# Patient Record
Sex: Female | Born: 1965 | Race: White | Hispanic: No | Marital: Married | Smoking: Former smoker
Health system: Southern US, Community
[De-identification: ages and names within clinical notes are randomized; demographics above are authoritative.]

## PROBLEM LIST (undated history)

## (undated) DIAGNOSIS — E669 Obesity, unspecified: Secondary | ICD-10-CM

## (undated) DIAGNOSIS — M1990 Unspecified osteoarthritis, unspecified site: Secondary | ICD-10-CM

## (undated) DIAGNOSIS — Z8701 Personal history of pneumonia (recurrent): Secondary | ICD-10-CM

## (undated) DIAGNOSIS — Z8489 Family history of other specified conditions: Secondary | ICD-10-CM

## (undated) DIAGNOSIS — R011 Cardiac murmur, unspecified: Secondary | ICD-10-CM

## (undated) DIAGNOSIS — R87619 Unspecified abnormal cytological findings in specimens from cervix uteri: Secondary | ICD-10-CM

## (undated) DIAGNOSIS — T148XXA Other injury of unspecified body region, initial encounter: Secondary | ICD-10-CM

## (undated) DIAGNOSIS — E7800 Pure hypercholesterolemia, unspecified: Secondary | ICD-10-CM

## (undated) HISTORY — PX: ESOPHAGOGASTRODUODENOSCOPY: SHX1529

## (undated) HISTORY — DX: Unspecified abnormal cytological findings in specimens from cervix uteri: R87.619

## (undated) HISTORY — DX: Obesity, unspecified: E66.9

## (undated) HISTORY — DX: Personal history of pneumonia (recurrent): Z87.01

## (undated) HISTORY — PX: KNEE SURGERY: SHX244

## (undated) HISTORY — DX: Pure hypercholesterolemia, unspecified: E78.00

## (undated) HISTORY — PX: TUBAL LIGATION: SHX77

---
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
## 2005-03-02 ENCOUNTER — Ambulatory Visit: Payer: Self-pay | Admitting: Specialist

## 2005-03-29 ENCOUNTER — Ambulatory Visit: Payer: Self-pay | Admitting: Specialist

## 2006-07-17 ENCOUNTER — Ambulatory Visit: Payer: Self-pay | Admitting: Family Medicine

## 2007-05-02 HISTORY — PX: ENDOMETRIAL ABLATION: SHX621

## 2008-11-05 ENCOUNTER — Ambulatory Visit: Payer: Self-pay | Admitting: Family Medicine

## 2010-05-24 ENCOUNTER — Ambulatory Visit: Payer: Self-pay | Admitting: Family Medicine

## 2012-12-27 LAB — HM PAP SMEAR

## 2013-01-09 ENCOUNTER — Ambulatory Visit: Payer: Self-pay | Admitting: Family Medicine

## 2014-11-06 ENCOUNTER — Other Ambulatory Visit: Payer: Self-pay | Admitting: Family Medicine

## 2014-11-06 MED ORDER — CLOBETASOL PROPIONATE 0.05 % EX CREA: 1.0000 "application " | TOPICAL_CREAM | Freq: Two times a day (BID) | CUTANEOUS | Status: DC | PRN

## 2014-11-06 NOTE — Telephone Encounter (Signed)
Patient is having itching, burning and irritation on her labia. She said she had the same issue 2 years ago and was given Clobetasol Propionate 0.05% cream. She wants to know if she can get a refill on it.

## 2014-12-28 DIAGNOSIS — E669 Obesity, unspecified: Secondary | ICD-10-CM | POA: Insufficient documentation

## 2014-12-28 DIAGNOSIS — E7800 Pure hypercholesterolemia, unspecified: Secondary | ICD-10-CM | POA: Insufficient documentation

## 2015-01-05 ENCOUNTER — Encounter: Payer: Self-pay | Admitting: Family Medicine

## 2015-01-05 ENCOUNTER — Ambulatory Visit (INDEPENDENT_AMBULATORY_CARE_PROVIDER_SITE_OTHER): Payer: 59 | Admitting: Family Medicine

## 2015-01-05 VITALS — BP 118/81 | HR 62 | Temp 97.0°F | Ht 62.75 in | Wt 168.0 lb

## 2015-01-05 DIAGNOSIS — Z0000 Encounter for general adult medical examination without abnormal findings: Secondary | ICD-10-CM | POA: Insufficient documentation

## 2015-01-05 DIAGNOSIS — IMO0001 Reserved for inherently not codable concepts without codable children: Secondary | ICD-10-CM

## 2015-01-05 DIAGNOSIS — E663 Overweight: Secondary | ICD-10-CM | POA: Insufficient documentation

## 2015-01-05 DIAGNOSIS — Z1211 Encounter for screening for malignant neoplasm of colon: Secondary | ICD-10-CM | POA: Insufficient documentation

## 2015-01-05 DIAGNOSIS — Z1239 Encounter for other screening for malignant neoplasm of breast: Secondary | ICD-10-CM | POA: Insufficient documentation

## 2015-01-05 DIAGNOSIS — R030 Elevated blood-pressure reading, without diagnosis of hypertension: Secondary | ICD-10-CM

## 2015-01-05 HISTORY — DX: Encounter for general adult medical examination without abnormal findings: Z00.00

## 2015-01-05 HISTORY — DX: Reserved for inherently not codable concepts without codable children: IMO0001

## 2015-01-05 NOTE — Assessment & Plan Note (Addendum)
SBE taught and encouraged; yearly to every other year mammograms; number given to patient for her to call and schedule her own appt; order entered by MD; see AVS

## 2015-01-05 NOTE — Progress Notes (Signed)
BP 118/81 mmHg  Pulse 62  Temp(Src) 97 F (36.1 C)  Ht 5' 2.75" (1.594 m)  Wt 168 lb (76.204 kg)  BMI 29.99 kg/m2  SpO2 100%   Subjective:    Patient ID: Stephanie Pineda, female    DOB: [birth year], 49 y.o.   MRN: [id number]  HPI: Stephanie Pineda is a 49 y.o. female  Chief Complaint  Patient presents with  . Annual Exam    last pap 11/2012   Since last visit, she had an injury in July, fell and took a bounce on her bottom; after a few weeks of pain, went to the doctor and has a little chip off of her bottom and still painful; was told it would take a while to heal; pulling tree and mechanical, not seizure or cardiac etiology No gynecologist; not due for pap; abnormal pap years and years ago, laser surgery, 1985; definitely had more than 3 normal paps since then; no problems No hx of colonoscopy; no family hx of colon cancer; no bleeding or change in bowel habits  Alcohol / tobacco: not an issue Aspirin: not old enough yet Osteoporosis: family hx only Depression screen PHQ 2/9 01/05/2015  Decreased Interest 0  Down, Depressed, Hopeless 0  PHQ - 2 Score 0  High blood pressure today; on Lodine for a month now almost; for the pelvic pain; no added salt; reads food labels for salt; no decongestants; tylenol occasionally No abuse HIV, Hep B, Hep C; declined Skin cancer: wears sunscreen  Relevant past medical, surgical, family and social history reviewed and updated as indicated. Interim medical history since our last visit reviewed. Allergies and medications reviewed and updated.  Review of Systems  Constitutional: Negative for fever, chills and unexpected weight change.  HENT: Negative for hearing loss.   Eyes: Negative for visual disturbance.  Respiratory: Negative for cough and shortness of breath.   Cardiovascular: Negative for chest pain.  Gastrointestinal: Negative for blood in stool.  Endocrine: Positive for heat intolerance.  Genitourinary: Negative for hematuria.   Musculoskeletal:       Pain from fall, see HPI  Skin:       Has a mole on right arm  Allergic/Immunologic: Positive for environmental allergies (a few allergies). Negative for food allergies.  Neurological: Negative for tremors.  Hematological: Bruises/bleeds easily (bruise on the arms since taking lodine just after helping move; no nosebleeds or gum bleeding).  Psychiatric/Behavioral: Negative for dysphoric mood.    Per HPI unless specifically indicated above     Objective:    BP 118/81 mmHg  Pulse 62  Temp(Src) 97 F (36.1 C)  Ht 5' 2.75" (1.594 m)  Wt 168 lb (76.204 kg)  BMI 29.99 kg/m2  SpO2 100%  Wt Readings from Last 3 Encounters:  01/05/15 168 lb (76.204 kg)  12/31/13 165 lb (74.844 kg)    Physical Exam  Constitutional: She appears well-developed and well-nourished.  HENT:  Head: Normocephalic and atraumatic.  Right Ear: Hearing, tympanic membrane, external ear and ear canal normal.  Left Ear: Hearing, tympanic membrane, external ear and ear canal normal.  Eyes: Conjunctivae and EOM are normal. Right eye exhibits no hordeolum. Left eye exhibits no hordeolum. No scleral icterus.  Neck: Carotid bruit is not present. No thyromegaly present.  Cardiovascular: Normal rate, regular rhythm, S1 normal, S2 normal and normal heart sounds.   No extrasystoles are present.  Pulmonary/Chest: Effort normal and breath sounds normal. No respiratory distress. Right breast exhibits no inverted nipple, no mass,  no nipple discharge, no skin change and no tenderness. Left breast exhibits no inverted nipple, no mass, no nipple discharge, no skin change and no tenderness. Breasts are symmetrical.  Abdominal: Soft. Normal appearance and bowel sounds are normal. She exhibits no distension, no abdominal bruit, no pulsatile midline mass and no mass. There is no hepatosplenomegaly. There is no tenderness. No hernia.  Musculoskeletal: Normal range of motion. She exhibits no edema.  Lymphadenopathy:        Head (right side): No submandibular adenopathy present.       Head (left side): No submandibular adenopathy present.    She has no cervical adenopathy.    She has no axillary adenopathy.  Neurological: She is alert. She displays no tremor. No cranial nerve deficit. She exhibits normal muscle tone. Gait normal.  Reflex Scores:      Patellar reflexes are 2+ on the right side and 2+ on the left side. Skin: Skin is warm and dry. Bruising (bruises on volar aspect distal forearms) and lesion (on the right deltoid, slightly papular erythematous, excoriated lesion about 5 mm diameter; positive Fitzpatrick's sign) noted. No ecchymosis noted. No cyanosis. No pallor.  Psychiatric: Her speech is normal and behavior is normal. Thought content normal. Her mood appears not anxious. She does not exhibit a depressed mood.    Results for orders placed or performed in visit on 12/28/14  HM PAP SMEAR  Result Value Ref Range   HM Pap smear per PP       Assessment & Plan:   Problem List Items Addressed This Visit      Other   Colon cancer screening - Primary    Refer to gastroenterologist      Relevant Orders   Ambulatory referral to Gastroenterology   Overweight (BMI 25.0-29.9)    Politely declined nutrition therapy referral; keep up working with trainer and watch portions and stay hydrated, limit empty calories      Elevated blood pressure    Recheck was normal; DASH guidelines; likely from Lodine and pain      Breast cancer screening    SBE taught and encouraged; yearly to every other year mammograms; number given to patient for her to call and schedule her own appt; order entered by MD; see AVS      Relevant Orders   MM Digital Screening   Preventative health care    Healthy living encouraged; see AVS      Relevant Orders   CBC with Differential/Platelet   Comprehensive metabolic panel   TSH   Lipid Panel w/o Chol/HDL Ratio     Follow up plan: Return in about 1 year (around  01/05/2016) for next physical.  An after-visit summary was printed and given to the patient at [facility].  Please see the patient instructions which may contain other information and recommendations beyond what is mentioned above in the assessment and plan.

## 2015-01-05 NOTE — Assessment & Plan Note (Signed)
Healthy living encouraged; see AVS

## 2015-01-05 NOTE — Patient Instructions (Addendum)
Your goal blood pressure is less than 140 mmHg on top. Try to follow the DASH guidelines (DASH stands for Dietary Approaches to Stop Hypertension) Try to limit the sodium in your diet.  Ideally, consume less than 1.5 grams (less than 1,$RemoveBefo'500mg'[id number]$ ) per day. Do not add salt when cooking or at the table.  Check the sodium amount on labels when shopping, and choose items lower in sodium when given a choice. Avoid or limit foods that already contain a lot of sodium. Eat a diet rich in fruits and vegetables and whole grains. 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 Talk with your health care provider about what schedule of regular examinations is right for you. This is a good chance for you to check in with your provider about disease prevention and staying healthy. In between checkups, there are plenty of things you can do on your own. Experts have done a lot of research about which lifestyle changes and preventive measures are most likely to keep you healthy. Ask your health care provider for more information. WEIGHT AND DIET  Eat a healthy diet  Be sure to include plenty of vegetables, fruits, low-fat dairy products, and lean protein.  Do not eat a lot of foods high in solid fats, added sugars, or salt.  Get regular exercise. This is one of the most important things you can do for your health.  Most adults should exercise for at least 150 minutes each week. The exercise should increase your heart rate and make you sweat (moderate-intensity exercise).  Most adults should also do strengthening exercises at least twice a week. This is in addition to the moderate-intensity exercise.  Maintain a healthy weight  Body mass index (BMI) is a measurement that can  be used to identify possible weight problems. It estimates body fat based on height and weight. Your health care provider can help determine your BMI and help you achieve or maintain a healthy weight.  For females 63 years of age and older:   A BMI below 18.5 is considered underweight.  A BMI of 18.5 to 24.9 is normal.  A BMI of 25 to 29.9 is considered overweight.  A BMI of 30 and above is considered obese.  Watch levels of cholesterol and blood lipids  You should start having your blood tested for lipids and cholesterol at 51 years of age, then have this test every 5 years.  You may need to have your cholesterol levels checked more often if:  Your lipid or cholesterol levels are high.  You are older than 51 years of age.  You are at high risk for heart disease.  CANCER SCREENING   Lung Cancer  Lung cancer screening is recommended for adults 25-59 years old who are at high risk for lung cancer because of a history of smoking.  A yearly low-dose CT scan of the lungs is recommended for people who:  Currently smoke.  Have quit within the past 15 years.  Have at least a 30-pack-year history of smoking. A pack year is smoking an average of one pack of cigarettes a day for 1 year.  Yearly screening should continue until it has been 15 years since you quit.  Yearly screening should  stop if you develop a health problem that would prevent you from having lung cancer treatment.  Breast Cancer  Practice breast self-awareness. This means understanding how your breasts normally appear and feel.  It also means doing regular breast self-exams. Let your health care provider know about any changes, no matter how small.  If you are in your 20s or 30s, you should have a clinical breast exam (CBE) by a health care provider every 1-3 years as part of a regular health exam.  If you are 59 or older, have a CBE every year. Also consider having a breast X-ray (mammogram) every year.  If  you have a family history of breast cancer, talk to your health care provider about genetic screening.  If you are at high risk for breast cancer, talk to your health care provider about having an MRI and a mammogram every year.  Breast cancer gene (BRCA) assessment is recommended for women who have family members with BRCA-related cancers. BRCA-related cancers include:  Breast.  Ovarian.  Tubal.  Peritoneal cancers.  Results of the assessment will determine the need for genetic counseling and BRCA1 and BRCA2 testing. Cervical Cancer Routine pelvic examinations to screen for cervical cancer are no longer recommended for nonpregnant women who are considered low risk for cancer of the pelvic organs (ovaries, uterus, and vagina) and who do not have symptoms. A pelvic examination may be necessary if you have symptoms including those associated with pelvic infections. Ask your health care provider if a screening pelvic exam is right for you.   The Pap test is the screening test for cervical cancer for women who are considered at risk.  If you had a hysterectomy for a problem that was not cancer or a condition that could lead to cancer, then you no longer need Pap tests.  If you are older than 65 years, and you have had normal Pap tests for the past 10 years, you no longer need to have Pap tests.  If you have had past treatment for cervical cancer or a condition that could lead to cancer, you need Pap tests and screening for cancer for at least 20 years after your treatment.  If you no longer get a Pap test, assess your risk factors if they change (such as having a new sexual partner). This can affect whether you should start being screened again.  Some women have medical problems that increase their chance of getting cervical cancer. If this is the case for you, your health care provider may recommend more frequent screening and Pap tests.  The human papillomavirus (HPV) test is another test  that may be used for cervical cancer screening. The HPV test looks for the virus that can cause cell changes in the cervix. The cells collected during the Pap test can be tested for HPV.  The HPV test can be used to screen women 31 years of age and older. Getting tested for HPV can extend the interval between normal Pap tests from three to five years.  An HPV test also should be used to screen women of any age who have unclear Pap test results.  After 51 years of age, women should have HPV testing as often as Pap tests.  Colorectal Cancer  This type of cancer can be detected and often prevented.  Routine colorectal cancer screening usually begins at 51 years of age and continues through 51 years of age.  Your health care provider may recommend screening at an earlier age  if you have risk factors for colon cancer.  Your health care provider may also recommend using home test kits to check for hidden blood in the stool.  A small camera at the end of a tube can be used to examine your colon directly (sigmoidoscopy or colonoscopy). This is done to check for the earliest forms of colorectal cancer.  Routine screening usually begins at age 10.  Direct examination of the colon should be repeated every 5-10 years through 51 years of age. However, you may need to be screened more often if early forms of precancerous polyps or small growths are found. Skin Cancer  Check your skin from head to toe regularly.  Tell your health care provider about any new moles or changes in moles, especially if there is a change in a mole's shape or color.  Also tell your health care provider if you have a mole that is larger than the size of a pencil eraser.  Always use sunscreen. Apply sunscreen liberally and repeatedly throughout the day.  Protect yourself by wearing long sleeves, pants, a wide-brimmed hat, and sunglasses whenever you are outside. HEART DISEASE, DIABETES, AND HIGH BLOOD PRESSURE   Have  your blood pressure checked at least every 1-2 years. High blood pressure causes heart disease and increases the risk of stroke.  If you are between 53 years and 51 years old, ask your health care provider if you should take aspirin to prevent strokes.  Have regular diabetes screenings. This involves taking a blood sample to check your fasting blood sugar level.  If you are at a normal weight and have a low risk for diabetes, have this test once every three years after 51 years of age.  If you are overweight and have a high risk for diabetes, consider being tested at a younger age or more often. PREVENTING INFECTION  Hepatitis B  If you have a higher risk for hepatitis B, you should be screened for this virus. You are considered at high risk for hepatitis B if:  You were born in a country where hepatitis B is common. Ask your health care provider which countries are considered high risk.  Your parents were born in a high-risk country, and you have not been immunized against hepatitis B (hepatitis B vaccine).  You have HIV or AIDS.  You use needles to inject street drugs.  You live with someone who has hepatitis B.  You have had sex with someone who has hepatitis B.  You get hemodialysis treatment.  You take certain medicines for conditions, including cancer, organ transplantation, and autoimmune conditions. Hepatitis C  Blood testing is recommended for:  Everyone born from [birth year] through 1967.  Anyone with known risk factors for hepatitis C. Sexually transmitted infections (STIs)  You should be screened for sexually transmitted infections (STIs) including gonorrhea and chlamydia if:  You are sexually active and are younger than 51 years of age.  You are older than 51 years of age and your health care provider tells you that you are at risk for this type of infection.  Your sexual activity has changed since you were last screened and you are at an increased risk for chlamydia  or gonorrhea. Ask your health care provider if you are at risk.  If you do not have HIV, but are at risk, it may be recommended that you take a prescription medicine daily to prevent HIV infection. This is called pre-exposure prophylaxis (PrEP). You are considered at risk if:  You are sexually active and do not regularly use condoms or know the HIV status of your partner(s).  You take drugs by injection.  You are sexually active with a partner who has HIV. Talk with your health care provider about whether you are at high risk of being infected with HIV. If you choose to begin PrEP, you should first be tested for HIV. You should then be tested every 3 months for as long as you are taking PrEP.  PREGNANCY   If you are premenopausal and you may become pregnant, ask your health care provider about preconception counseling.  If you may become pregnant, take 400 to 800 micrograms (mcg) of folic acid every day.  If you want to prevent pregnancy, talk to your health care provider about birth control (contraception). OSTEOPOROSIS AND MENOPAUSE   Osteoporosis is a disease in which the bones lose minerals and strength with aging. This can result in serious bone fractures. Your risk for osteoporosis can be identified using a bone density scan.  If you are 53 years of age or older, or if you are at risk for osteoporosis and fractures, ask your health care provider if you should be screened.  Ask your health care provider whether you should take a calcium or vitamin D supplement to lower your risk for osteoporosis.  Menopause may have certain physical symptoms and risks.  Hormone replacement therapy may reduce some of these symptoms and risks. Talk to your health care provider about whether hormone replacement therapy is right for you.  HOME CARE INSTRUCTIONS   Schedule regular health, dental, and eye exams.  Stay current with your immunizations.   Do not use any tobacco products including  cigarettes, chewing tobacco, or electronic cigarettes.  If you are pregnant, do not drink alcohol.  If you are breastfeeding, limit how much and how often you drink alcohol.  Limit alcohol intake to no more than 1 drink per day for nonpregnant women. One drink equals 12 ounces of beer, 5 ounces of wine, or 1 ounces of hard liquor.  Do not use street drugs.  Do not share needles.  Ask your health care provider for help if you need support or information about quitting drugs.  Tell your health care provider if you often feel depressed.  Tell your health care provider if you have ever been abused or do not feel safe at home. Document Released: 10/31/2010 Document Revised: 09/01/2013 Document Reviewed: 03/19/2013 [place] Patient Information 2015 [place], [place]. This information is not intended to replace advice given to you by your health care provider. Make sure you discuss any questions you have with your health care provider. DASH Eating Plan DASH stands for "Dietary Approaches to Stop Hypertension." The DASH eating plan is a healthy eating plan that has been shown to reduce high blood pressure (hypertension). Additional health benefits may include reducing the risk of type 2 diabetes mellitus, heart disease, and stroke. The DASH eating plan may also help with weight loss. WHAT DO I NEED TO KNOW ABOUT THE DASH EATING PLAN? For the DASH eating plan, you will follow these general guidelines:  Choose foods with a percent daily value for sodium of less than 5% (as listed on the food label).  Use salt-free seasonings or herbs instead of table salt or sea salt.  Check with your health care provider or pharmacist before using salt substitutes.  Eat lower-sodium products, often labeled as "lower sodium" or "no salt added."  Eat fresh foods.  Eat more  vegetables, fruits, and low-fat dairy products.  Choose whole grains. Look for the word "whole" as the first word in the ingredient  list.  Choose fish and skinless chicken or [place] more often than red meat. Limit fish, poultry, and meat to 6 oz (170 g) each day.  Limit sweets, desserts, sugars, and sugary drinks.  Choose heart-healthy fats.  Limit cheese to 1 oz (28 g) per day.  Eat more home-cooked food and less restaurant, buffet, and fast food.  Limit fried foods.  Cook foods using methods other than frying.  Limit canned vegetables. If you do use them, rinse them well to decrease the sodium.  When eating at a restaurant, ask that your food be prepared with less salt, or no salt if possible. WHAT FOODS CAN I EAT? Seek help from a dietitian for individual calorie needs. Grains Whole grain or whole wheat bread. Brown Peri. Whole grain or whole wheat pasta. Quinoa, bulgur, and whole grain cereals. Low-sodium cereals. Corn or whole wheat flour tortillas. Whole grain cornbread. Whole grain crackers. Low-sodium crackers. Vegetables Fresh or frozen vegetables (raw, steamed, roasted, or grilled). Low-sodium or reduced-sodium tomato and vegetable juices. Low-sodium or reduced-sodium tomato sauce and paste. Low-sodium or reduced-sodium canned vegetables.  Fruits All fresh, canned (in natural juice), or frozen fruits. Meat and Other Protein Products Ground beef (85% or leaner), grass-fed beef, or beef trimmed of fat. Skinless chicken or [place]. Ground chicken or [place]. Pork trimmed of fat. All fish and seafood. Eggs. Dried beans, peas, or lentils. Unsalted nuts and seeds. Unsalted canned beans. Dairy Low-fat dairy products, such as skim or 1% milk, 2% or reduced-fat cheeses, low-fat ricotta or cottage cheese, or plain low-fat yogurt. Low-sodium or reduced-sodium cheeses. Fats and Oils Tub margarines without trans fats. Light or reduced-fat mayonnaise and salad dressings (reduced sodium). Avocado. Safflower, olive, or canola oils. Natural peanut or almond butter. Other Unsalted popcorn and pretzels. The items listed  above may not be a complete list of recommended foods or beverages. Contact your dietitian for more options. WHAT FOODS ARE NOT RECOMMENDED? Grains White bread. White pasta. White Troupe. Refined cornbread. Bagels and croissants. Crackers that contain trans fat. Vegetables Creamed or fried vegetables. Vegetables in a cheese sauce. Regular canned vegetables. Regular canned tomato sauce and paste. Regular tomato and vegetable juices. Fruits Dried fruits. Canned fruit in light or heavy syrup. Fruit juice. Meat and Other Protein Products Fatty cuts of meat. Ribs, chicken wings, bacon, sausage, bologna, salami, chitterlings, fatback, hot dogs, bratwurst, and packaged luncheon meats. Salted nuts and seeds. Canned beans with salt. Dairy Whole or 2% milk, cream, half-and-half, and cream cheese. Whole-fat or sweetened yogurt. Full-fat cheeses or blue cheese. Nondairy creamers and whipped toppings. Processed cheese, cheese spreads, or cheese curds. Condiments Onion and garlic salt, seasoned salt, table salt, and sea salt. Canned and packaged gravies. Worcestershire sauce. Tartar sauce. Barbecue sauce. Teriyaki sauce. Soy sauce, including reduced sodium. Steak sauce. Fish sauce. Oyster sauce. Cocktail sauce. Horseradish. Ketchup and mustard. Meat flavorings and tenderizers. Bouillon cubes. Hot sauce. Tabasco sauce. Marinades. Taco seasonings. Relishes. Fats and Oils Butter, stick margarine, lard, shortening, ghee, and bacon fat. Coconut, palm kernel, or palm oils. Regular salad dressings. Other Pickles and olives. Salted popcorn and pretzels. The items listed above may not be a complete list of foods and beverages to avoid. Contact your dietitian for more information. WHERE CAN I FIND MORE INFORMATION? National Heart, Lung, and Blood Institute: travelstabloid.com Document Released: 04/06/2011 Document Revised: 09/01/2013 Document Reviewed: 02/19/2013  [employer] Patient  Information 2015 [place]. This information is not intended to replace advice given to you by your health care provider. Make sure you discuss any questions you have with your health care provider.

## 2015-01-05 NOTE — Assessment & Plan Note (Signed)
Recheck was normal; DASH guidelines; likely from [place] and pain

## 2015-01-05 NOTE — Assessment & Plan Note (Signed)
Politely declined nutrition therapy referral; keep up working with trainer and watch portions and stay hydrated, limit empty calories

## 2015-01-05 NOTE — Assessment & Plan Note (Signed)
Refer to gastroenterologist 

## 2015-01-06 ENCOUNTER — Encounter: Payer: Self-pay | Admitting: Family Medicine

## 2015-01-06 LAB — COMPREHENSIVE METABOLIC PANEL
ALBUMIN: 4.2 g/dL (ref 3.5–5.5)
ALK PHOS: 55 IU/L (ref 39–117)
ALT: 17 IU/L (ref 0–32)
AST: 18 IU/L (ref 0–40)
Albumin/Globulin Ratio: 1.6 (ref 1.1–2.5)
BILIRUBIN TOTAL: 0.4 mg/dL (ref 0.0–1.2)
BUN / CREAT RATIO: 19 (ref 9–23)
BUN: 13 mg/dL (ref 6–24)
CHLORIDE: 103 mmol/L (ref 97–108)
CO2: 21 mmol/L (ref 18–29)
Calcium: 9.6 mg/dL (ref 8.7–10.2)
Creatinine, Ser: 0.67 mg/dL (ref 0.57–1.00)
GFR calc Af Amer: 119 mL/min/{1.73_m2} (ref >59)
GFR calc non Af Amer: 103 mL/min/{1.73_m2} (ref >59)
Globulin, Total: 2.7 g/dL (ref 1.5–4.5)
Glucose: 94 mg/dL (ref 65–99)
Potassium: 3.9 mmol/L (ref 3.5–5.2)
Sodium: 141 mmol/L (ref 134–144)
Total Protein: 6.9 g/dL (ref 6.0–8.5)

## 2015-01-06 LAB — CBC WITH DIFFERENTIAL/PLATELET
BASOS ABS: 0 10*3/uL (ref 0.0–0.2)
Basos: 0 %
EOS (ABSOLUTE): 0.1 10*3/uL (ref 0.0–0.4)
Eos: 2 %
HEMOGLOBIN: 12.6 g/dL (ref 11.1–15.9)
Hematocrit: 37 % (ref 34.0–46.6)
Immature Grans (Abs): 0 10*3/uL (ref 0.0–0.1)
Immature Granulocytes: 0 %
Lymphocytes Absolute: 1.7 10*3/uL (ref 0.7–3.1)
Lymphs: 33 %
MCH: 29.5 pg (ref 26.6–33.0)
MCHC: 34.1 g/dL (ref 31.5–35.7)
MCV: 87 fL (ref 79–97)
MONOCYTES: 9 %
Monocytes Absolute: 0.4 10*3/uL (ref 0.1–0.9)
NEUTROS ABS: 2.8 10*3/uL (ref 1.4–7.0)
Neutrophils: 56 %
Platelets: 224 10*3/uL (ref 150–379)
RBC: 4.27 x10E6/uL (ref 3.77–5.28)
RDW: 14.1 % (ref 12.3–15.4)
WBC: 5 10*3/uL (ref 3.4–10.8)

## 2015-01-06 LAB — LIPID PANEL W/O CHOL/HDL RATIO
CHOLESTEROL TOTAL: 200 mg/dL — AB (ref 100–199)
HDL: 77 mg/dL (ref >39)
LDL Calculated: 111 mg/dL — ABNORMAL HIGH (ref 0–99)
Triglycerides: 59 mg/dL (ref 0–149)
VLDL Cholesterol Cal: 12 mg/dL (ref 5–40)

## 2015-01-06 LAB — TSH: TSH: 0.726 u[IU]/mL (ref 0.450–4.500)

## 2015-01-12 ENCOUNTER — Other Ambulatory Visit: Payer: Self-pay

## 2015-01-12 ENCOUNTER — Telehealth: Payer: Self-pay | Admitting: Gastroenterology

## 2015-01-12 NOTE — Telephone Encounter (Signed)
Gastroenterology Pre-Procedure Review  Request Date:02-18-2015 Requesting Physician: Dr.Lada  PATIENT REVIEW QUESTIONS: The patient responded to the following health history questions as indicated:    1. Are you having any GI issues? no 2. Do you have a personal history of Polyps? no 3. Do you have a family history of Colon Cancer or Polyps? no 4. Diabetes Mellitus? no 5. Joint replacements in the past 12 months?no 6. Major health problems in the past 3 months?no 7. Any artificial heart valves, MVP, or defibrillator?no    MEDICATIONS & ALLERGIES:    Patient reports the following regarding taking any anticoagulation/antiplatelet therapy:   Plavix, Coumadin, Eliquis, Xarelto, Lovenox, Pradaxa, Brilinta, or Effient? no Aspirin? no  Patient confirms/reports the following medications:  Current Outpatient Prescriptions  Medication Sig Dispense Refill   clobetasol cream (TEMOVATE) 7.37 % Apply 1 application topically 2 (two) times daily as needed. 15 g 0   etodolac (LODINE) 400 MG tablet Take 400 mg by mouth 2 (two) times daily.      No current facility-administered medications for this visit.    Patient confirms/reports the following allergies:  No Known Allergies  No orders of the defined types were placed in this encounter.    AUTHORIZATION INFORMATION Primary Insurance: 1D#: Group #:  Secondary Insurance: 1D#: Group #:  SCHEDULE INFORMATION: Date: 02-18-2015 Time: Location:[facility]

## 2015-01-20 ENCOUNTER — Ambulatory Visit
Admission: RE | Admit: 2015-01-20 | Discharge: 2015-01-20 | Disposition: A | Discharge status: Home / Self Care | Payer: 59 | Source: Ambulatory Visit | Attending: Family Medicine | Admitting: Family Medicine

## 2015-01-20 DIAGNOSIS — Z1231 Encounter for screening mammogram for malignant neoplasm of breast: Secondary | ICD-10-CM | POA: Insufficient documentation

## 2015-02-10 ENCOUNTER — Encounter: Payer: Self-pay | Admitting: *Deleted

## 2015-02-18 ENCOUNTER — Encounter
Admission: RE | Disposition: A | Discharge status: Home / Self Care | Payer: Self-pay | Source: Ambulatory Visit | Attending: Gastroenterology

## 2015-02-18 ENCOUNTER — Ambulatory Visit: Payer: 59 | Admitting: Anesthesiology

## 2015-02-18 ENCOUNTER — Ambulatory Visit
Admission: RE | Admit: 2015-02-18 | Discharge: 2015-02-18 | Disposition: A | Discharge status: Home / Self Care | Payer: 59 | Source: Ambulatory Visit | Attending: Gastroenterology | Admitting: Gastroenterology

## 2015-02-18 ENCOUNTER — Encounter: Payer: Self-pay | Admitting: *Deleted

## 2015-02-18 DIAGNOSIS — Z6829 Body mass index (BMI) 29.0-29.9, adult: Secondary | ICD-10-CM | POA: Diagnosis not present

## 2015-02-18 DIAGNOSIS — Z79899 Other long term (current) drug therapy: Secondary | ICD-10-CM | POA: Insufficient documentation

## 2015-02-18 DIAGNOSIS — Z8349 Family history of other endocrine, nutritional and metabolic diseases: Secondary | ICD-10-CM | POA: Diagnosis not present

## 2015-02-18 DIAGNOSIS — Z808 Family history of malignant neoplasm of other organs or systems: Secondary | ICD-10-CM | POA: Diagnosis not present

## 2015-02-18 DIAGNOSIS — Z833 Family history of diabetes mellitus: Secondary | ICD-10-CM | POA: Diagnosis not present

## 2015-02-18 DIAGNOSIS — Z8249 Family history of ischemic heart disease and other diseases of the circulatory system: Secondary | ICD-10-CM | POA: Diagnosis not present

## 2015-02-18 DIAGNOSIS — K5730 Diverticulosis of large intestine without perforation or abscess without bleeding: Secondary | ICD-10-CM | POA: Diagnosis not present

## 2015-02-18 DIAGNOSIS — Z825 Family history of asthma and other chronic lower respiratory diseases: Secondary | ICD-10-CM | POA: Diagnosis not present

## 2015-02-18 DIAGNOSIS — Z8701 Personal history of pneumonia (recurrent): Secondary | ICD-10-CM | POA: Diagnosis not present

## 2015-02-18 DIAGNOSIS — E7800 Pure hypercholesterolemia, unspecified: Secondary | ICD-10-CM | POA: Diagnosis not present

## 2015-02-18 DIAGNOSIS — Z1211 Encounter for screening for malignant neoplasm of colon: Secondary | ICD-10-CM | POA: Diagnosis present

## 2015-02-18 DIAGNOSIS — Z83511 Family history of glaucoma: Secondary | ICD-10-CM | POA: Insufficient documentation

## 2015-02-18 DIAGNOSIS — Z8262 Family history of osteoporosis: Secondary | ICD-10-CM | POA: Insufficient documentation

## 2015-02-18 DIAGNOSIS — E669 Obesity, unspecified: Secondary | ICD-10-CM | POA: Insufficient documentation

## 2015-02-18 DIAGNOSIS — Z807 Family history of other malignant neoplasms of lymphoid, hematopoietic and related tissues: Secondary | ICD-10-CM | POA: Insufficient documentation

## 2015-02-18 DIAGNOSIS — Z87891 Personal history of nicotine dependence: Secondary | ICD-10-CM | POA: Insufficient documentation

## 2015-02-18 HISTORY — DX: Other injury of unspecified body region, initial encounter: T14.8XXA

## 2015-02-18 HISTORY — DX: Cardiac murmur, unspecified: R01.1

## 2015-02-18 HISTORY — DX: Unspecified osteoarthritis, unspecified site: M19.90

## 2015-02-18 HISTORY — PX: COLONOSCOPY WITH PROPOFOL: SHX5780

## 2015-02-18 HISTORY — DX: Family history of other specified conditions: Z84.89

## 2015-02-18 SURGERY — COLONOSCOPY WITH PROPOFOL
Anesthesia: Monitor Anesthesia Care | Wound class: Contaminated

## 2015-02-18 MED ORDER — SIMETHICONE 40 MG/0.6ML PO SUSP
ORAL | Status: DC | PRN
  Administered 2015-02-18: 11:00:00

## 2015-02-18 MED ORDER — LACTATED RINGERS IV SOLN: 500.0000 mL | INTRAVENOUS | Status: DC

## 2015-02-18 MED ORDER — ACETAMINOPHEN 160 MG/5ML PO SOLN: 325.0000 mg | ORAL | Status: DC | PRN

## 2015-02-18 MED ORDER — PROPOFOL 10 MG/ML IV BOLUS
INTRAVENOUS | Status: DC | PRN
  Administered 2015-02-18: 70 mg via INTRAVENOUS
  Administered 2015-02-18: 20 mg via INTRAVENOUS
  Administered 2015-02-18: 40 mg via INTRAVENOUS
  Administered 2015-02-18 (×2): 30 mg via INTRAVENOUS

## 2015-02-18 MED ORDER — OXYCODONE HCL 5 MG/5ML PO SOLN: 5.0000 mg | Freq: Once | ORAL | Status: DC | PRN

## 2015-02-18 MED ORDER — SODIUM CHLORIDE 0.9 % IV SOLN: INTRAVENOUS | Status: DC

## 2015-02-18 MED ORDER — OXYCODONE HCL 5 MG PO TABS: 5.0000 mg | ORAL_TABLET | Freq: Once | ORAL | Status: DC | PRN

## 2015-02-18 MED ORDER — LACTATED RINGERS IV SOLN
INTRAVENOUS | Status: DC
  Administered 2015-02-18: 10:00:00 via INTRAVENOUS

## 2015-02-18 MED ORDER — FENTANYL CITRATE (PF) 100 MCG/2ML IJ SOLN: 25.0000 ug | INTRAMUSCULAR | Status: DC | PRN

## 2015-02-18 MED ORDER — DEXAMETHASONE SODIUM PHOSPHATE 4 MG/ML IJ SOLN: 8.0000 mg | Freq: Once | INTRAMUSCULAR | Status: DC | PRN

## 2015-02-18 MED ORDER — LIDOCAINE HCL (CARDIAC) 20 MG/ML IV SOLN
INTRAVENOUS | Status: DC | PRN
  Administered 2015-02-18: 50 mg via INTRAVENOUS

## 2015-02-18 MED ORDER — ACETAMINOPHEN 325 MG PO TABS: 325.0000 mg | ORAL_TABLET | ORAL | Status: DC | PRN

## 2015-02-18 SURGICAL SUPPLY — 28 items

## 2015-02-18 NOTE — Discharge Instructions (Signed)

## 2015-02-18 NOTE — Anesthesia Preprocedure Evaluation (Signed)
Anesthesia Evaluation  Patient identified by MRN, date of birth, ID band Patient awake    Reviewed: Allergy & Precautions, H&P , NPO status , Patient's Chart, lab work & pertinent test results, reviewed documented beta blocker date and time   Airway Mallampati: II  TM Distance: >3 FB Neck ROM: full    Dental no notable dental hx.    Pulmonary former smoker,    Pulmonary exam normal breath sounds clear to auscultation       Cardiovascular Exercise Tolerance: Good + Valvular Problems/Murmurs  Rhythm:regular Rate:Normal     Neuro/Psych negative neurological ROS  negative psych ROS   GI/Hepatic negative GI ROS, Neg liver ROS,   Endo/Other  negative endocrine ROS  Renal/GU negative Renal ROS  negative genitourinary   Musculoskeletal   Abdominal   Peds  Hematology negative hematology ROS (+)   Anesthesia Other Findings   Reproductive/Obstetrics negative OB ROS                             Anesthesia Physical Anesthesia Plan  ASA: III  Anesthesia Plan: MAC   Post-op Pain Management:    Induction:   Airway Management Planned:   Additional Equipment:   Intra-op Plan:   Post-operative Plan:   Informed Consent: I have reviewed the patients History and Physical, chart, labs and discussed the procedure including the risks, benefits and alternatives for the proposed anesthesia with the patient or authorized representative who has indicated his/her understanding and acceptance.     Plan Discussed with: CRNA  Anesthesia Plan Comments:         Anesthesia Quick Evaluation

## 2015-02-18 NOTE — Transfer of Care (Signed)
Immediate Anesthesia Transfer of Care Note  Patient: Stephanie Pineda  Procedure(s) Performed: Procedure(s): COLONOSCOPY WITH PROPOFOL (N/A)  Patient Location: PACU  Anesthesia Type: MAC  Level of Consciousness: awake, alert  and patient cooperative  Airway and Oxygen Therapy: Patient Spontanous Breathing and Patient connected to supplemental oxygen  Post-op Assessment: Post-op Vital signs reviewed, Patient's Cardiovascular Status Stable, Respiratory Function Stable, Patent Airway and No signs of Nausea or vomiting  Post-op Vital Signs: Reviewed and stable  Complications: No apparent anesthesia complications

## 2015-02-18 NOTE — Anesthesia Procedure Notes (Signed)
Procedure Name: MAC Performed by: Kortlyn Koltz Pre-anesthesia Checklist: Patient identified, Emergency Drugs available, Suction available, Timeout performed and Patient being monitored Patient Re-evaluated:Patient Re-evaluated prior to inductionOxygen Delivery Method: Nasal cannula Placement Confirmation: positive ETCO2       

## 2015-02-18 NOTE — Op Note (Signed)
[facility] Patient Name: Stephanie Pineda Procedure Date: 02/18/2015 10:36 AM MRN: [id number] Account #: [id number] Date of Birth: 01-Mar-1966 Admit Type: Outpatient Age: 49 Room: [facility] OR ROOM 01 Gender: Female Note Status: Finalized Procedure:         Colonoscopy Indications:       Screening for colorectal malignant neoplasm Providers:         Lucilla Lame, MD Referring MD:      Arnetha Courser (Referring MD) Medicines:         Propofol per Anesthesia Complications:     No immediate complications. Procedure:         Pre-Anesthesia Assessment:                    - Prior to the procedure, a History and Physical was                     performed, and patient medications and allergies were                     reviewed. The patient's tolerance of previous anesthesia                     was also reviewed. The risks and benefits of the procedure                     and the sedation options and risks were discussed with the                     patient. All questions were answered, and informed consent                     was obtained. Prior Anticoagulants: The patient has taken                     no previous anticoagulant or antiplatelet agents. ASA                     Grade Assessment: II - A patient with mild systemic                     disease. After reviewing the risks and benefits, the                     patient was deemed in satisfactory condition to undergo                     the procedure.                    After obtaining informed consent, the colonoscope was                     passed under direct vision. Throughout the procedure, the                     patient's blood pressure, pulse, and oxygen saturations                     were monitored continuously. The Olympus CF H180AL                     colonoscope (S#: [id number]) was introduced through the anus  and advanced to the the cecum, identified by appendiceal         orifice and ileocecal valve. The colonoscopy was performed                     without difficulty. The patient tolerated the procedure                     well. The quality of the bowel preparation was excellent. Findings:      The perianal and digital rectal examinations were normal.      A few small-mouthed diverticula were found in the sigmoid colon. Impression:        - Diverticulosis in the sigmoid colon.                    - No specimens collected. Recommendation:    - Repeat colonoscopy in 10 years for screening unless any                     change in family history or lower GI problems. Procedure Code(s): --- Professional ---                    [phone], Colonoscopy, flexible; diagnostic, including                     collection of specimen(s) by brushing or washing, when                     performed (separate procedure) Diagnosis Code(s): --- Professional ---                    Z12.11, Encounter for screening for malignant neoplasm of                     colon CPT copyright 2014 American Medical Association. All rights reserved. The codes documented in this report are preliminary and upon coder review may  be revised to meet current compliance requirements. Lucilla Lame, MD 02/18/2015 10:55:51 AM This report has been signed electronically. Number of Addenda: 0 Note Initiated On: 02/18/2015 10:36 AM Scope Withdrawal Time: 0 hours 7 minutes 13 seconds  Total Procedure Duration: 0 hours 13 minutes 5 seconds       [facility]

## 2015-02-18 NOTE — Anesthesia Postprocedure Evaluation (Signed)
  Anesthesia Post-op Note  Patient: Stephanie Pineda  Procedure(s) Performed: Procedure(s): COLONOSCOPY WITH PROPOFOL (N/A)  Anesthesia type:MAC  Patient location: PACU  Post pain: Pain level controlled  Post assessment: Post-op Vital signs reviewed, Patient's Cardiovascular Status Stable, Respiratory Function Stable, Patent Airway and No signs of Nausea or vomiting  Post vital signs: Reviewed and stable  Last Vitals:  Filed Vitals:   02/18/15 1109  BP: 118/82  Pulse: 62  Temp:   Resp: 19    Level of consciousness: awake, alert  and patient cooperative  Complications: No apparent anesthesia complications

## 2015-02-18 NOTE — H&P (Signed)
[facility]  [address] [place], [place] [address] Phone: [phone] Fax : [phone]  Primary Care Physician:  Enid Derry, MD Primary Gastroenterologist:  Dr. Allen Norris  Pre-Procedure History & Physical: HPI:  Stephanie Pineda is a 49 y.o. female is here for a screening colonoscopy.   Past Medical History  Diagnosis Date  . Obesity   . Hypercholesteremia   . History of pneumonia 1983  . Family history of adverse reaction to anesthesia     Brother "flat-lined" during appendectomy > 20 yrs ago. Has had stents placed since without issues.  Marland Kitchen Heart murmur     followed by PCP  . Arthritis     right hip, left knee  . Bruised     Pelvic bone - right side    Past Surgical History  Procedure Laterality Date  . Endometrial ablation  2009    laser surgery for Dysplasia  . Esophagogastroduodenoscopy    . Tubal ligation      Prior to Admission medications   Medication Sig Start Date End Date Taking? Authorizing Provider  clobetasol cream (TEMOVATE) 2.69 % Apply 1 application topically 2 (two) times daily as needed. 11/06/14  Yes Arnetha Courser, MD    Allergies as of 01/12/2015  . (No Known Allergies)    Family History  Problem Relation Age of Onset  . Asthma Mother   . Hyperlipidemia Mother   . Thyroid disease Mother   . COPD Mother   . Osteoporosis Mother   . Heart disease Mother   . Hyperlipidemia Father   . Heart disease Father   . Hypertension Father   . COPD Father   . Glaucoma Father   . Cancer Sister     melanoma  . Cancer Brother     lymphoma  . Diabetes Brother   . Heart disease Brother   . Hypertension Brother   . Thyroid disease Daughter   . Diabetes Maternal Grandmother     Social History   Social History  . Marital Status: Married    Spouse Name: N/A  . Number of Children: N/A  . Years of Education: N/A   Occupational History  . Not on file.   Social History Main Topics  . Smoking status: Former Smoker -- 1.00  packs/day for 20 years    Types: Cigarettes    Quit date: 05/01/2006  . Smokeless tobacco: Never Used  . Alcohol Use: Yes     Comment: 2 a month  . Drug Use: No  . Sexual Activity: Not on file   Other Topics Concern  . Not on file   Social History Narrative    Review of Systems: See HPI, otherwise negative ROS  Physical Exam: BP 122/80 mmHg  Pulse 57  Temp(Src) 98.2 F (36.8 C)  Resp 16  Ht 5' 2.75" (1.594 m)  Wt 168 lb (76.204 kg)  BMI 29.99 kg/m2  SpO2 100%  LMP 12/31/2007 General:   Alert,  pleasant and cooperative in NAD Head:  Normocephalic and atraumatic. Neck:  Supple; no masses or thyromegaly. Lungs:  Clear throughout to auscultation.    Heart:  Regular rate and rhythm. Abdomen:  Soft, nontender and nondistended. Normal bowel sounds, without guarding, and without rebound.   Neurologic:  Alert and  oriented x4;  grossly normal neurologically.  Impression/Plan: Stephanie Pineda is now here to undergo a screening colonoscopy.  Risks, benefits, and alternatives regarding colonoscopy have been reviewed with the patient.  Questions have been answered.  All parties agreeable.

## 2015-02-19 ENCOUNTER — Encounter: Payer: Self-pay | Admitting: Gastroenterology

## 2015-10-01 ENCOUNTER — Telehealth: Payer: Self-pay | Admitting: Family Medicine

## 2015-10-01 ENCOUNTER — Other Ambulatory Visit: Payer: Self-pay

## 2015-10-01 NOTE — Telephone Encounter (Signed)
Pt is asking for a refill on the steroid cream. Pharm is [facility] on [address].

## 2015-10-02 MED ORDER — CLOBETASOL PROPIONATE 0.05 % EX CREA: 1.0000 "application " | TOPICAL_CREAM | Freq: Two times a day (BID) | CUTANEOUS | Status: DC | PRN

## 2015-10-05 NOTE — Telephone Encounter (Signed)
This was already sent on 10/02/15; please verify with pharmacy

## 2015-10-05 NOTE — Telephone Encounter (Signed)
Pt states she has a heat rash in her private area. Pt states its burning,painful and itching.

## 2015-10-06 NOTE — Telephone Encounter (Signed)
Please verify with pharmacy; thank you

## 2015-10-06 NOTE — Telephone Encounter (Signed)
Pt.notified

## 2015-10-06 NOTE — Telephone Encounter (Signed)
Received call that her prescription had been sent to the pharmacy, but when she called they did not have it. I have her the date and time of the confirmation that it was faxed to [place]. Patient asking for [place] to please resend. Thank you

## 2015-11-03 ENCOUNTER — Other Ambulatory Visit: Payer: Self-pay | Admitting: Unknown Physician Specialty

## 2015-11-03 DIAGNOSIS — H905 Unspecified sensorineural hearing loss: Secondary | ICD-10-CM

## 2015-11-03 DIAGNOSIS — H903 Sensorineural hearing loss, bilateral: Secondary | ICD-10-CM

## 2015-11-18 ENCOUNTER — Ambulatory Visit
Admission: RE | Admit: 2015-11-18 | Discharge: 2015-11-18 | Disposition: A | Discharge status: Home / Self Care | Payer: Commercial Managed Care - HMO | Source: Ambulatory Visit | Attending: Unknown Physician Specialty | Admitting: Unknown Physician Specialty

## 2015-11-18 DIAGNOSIS — H903 Sensorineural hearing loss, bilateral: Secondary | ICD-10-CM

## 2015-11-18 DIAGNOSIS — H905 Unspecified sensorineural hearing loss: Secondary | ICD-10-CM | POA: Diagnosis present

## 2015-11-18 MED ORDER — GADOBENATE DIMEGLUMINE 529 MG/ML IV SOLN
15.0000 mL | Freq: Once | INTRAVENOUS | Status: AC | PRN
  Administered 2015-11-18: 15 mL via INTRAVENOUS

## 2016-01-10 ENCOUNTER — Encounter: Payer: Self-pay | Admitting: Family Medicine

## 2016-01-10 ENCOUNTER — Other Ambulatory Visit: Payer: Self-pay | Admitting: Family Medicine

## 2016-01-10 ENCOUNTER — Ambulatory Visit (INDEPENDENT_AMBULATORY_CARE_PROVIDER_SITE_OTHER): Payer: 59 | Admitting: Family Medicine

## 2016-01-10 VITALS — BP 116/74 | HR 68 | Temp 98.2°F | Resp 14 | Wt 177.6 lb

## 2016-01-10 DIAGNOSIS — Z0000 Encounter for general adult medical examination without abnormal findings: Secondary | ICD-10-CM | POA: Diagnosis not present

## 2016-01-10 DIAGNOSIS — D333 Benign neoplasm of cranial nerves: Secondary | ICD-10-CM | POA: Diagnosis not present

## 2016-01-10 DIAGNOSIS — Z1239 Encounter for other screening for malignant neoplasm of breast: Secondary | ICD-10-CM | POA: Diagnosis not present

## 2016-01-10 DIAGNOSIS — Z124 Encounter for screening for malignant neoplasm of cervix: Secondary | ICD-10-CM

## 2016-01-10 LAB — LIPID PANEL
CHOLESTEROL: 224 mg/dL — AB (ref 125–200)
HDL: 80 mg/dL (ref >=46)
LDL CALC: 128 mg/dL (ref <130)
TRIGLYCERIDES: 80 mg/dL (ref <150)
Total CHOL/HDL Ratio: 2.8 Ratio (ref <=5.0)
VLDL: 16 mg/dL (ref <30)

## 2016-01-10 LAB — CBC WITH DIFFERENTIAL/PLATELET
Basophils Absolute: 0 cells/uL (ref 0–200)
Basophils Relative: 0 %
EOS PCT: 2 %
Eosinophils Absolute: 94 cells/uL (ref 15–500)
HCT: 39.9 % (ref 35.0–45.0)
Hemoglobin: 13.4 g/dL (ref 11.7–15.5)
LYMPHS PCT: 36 %
Lymphs Abs: 1692 cells/uL (ref 850–3900)
MCH: 29.2 pg (ref 27.0–33.0)
MCHC: 33.6 g/dL (ref 32.0–36.0)
MCV: 86.9 fL (ref 80.0–100.0)
MPV: 10.3 fL (ref 7.5–12.5)
Monocytes Absolute: 470 cells/uL (ref 200–950)
Monocytes Relative: 10 %
NEUTROS PCT: 52 %
Neutro Abs: 2444 cells/uL (ref 1500–7800)
Platelets: 230 10*3/uL (ref 140–400)
RBC: 4.59 MIL/uL (ref 3.80–5.10)
RDW: 13.7 % (ref 11.0–15.0)
WBC: 4.7 10*3/uL (ref 3.8–10.8)

## 2016-01-10 LAB — COMPLETE METABOLIC PANEL WITH GFR
ALT: 18 U/L (ref 6–29)
AST: 21 U/L (ref 10–35)
Albumin: 4.3 g/dL (ref 3.6–5.1)
Alkaline Phosphatase: 53 U/L (ref 33–130)
BILIRUBIN TOTAL: 0.4 mg/dL (ref 0.2–1.2)
BUN: 12 mg/dL (ref 7–25)
CALCIUM: 9.7 mg/dL (ref 8.6–10.4)
CO2: 30 mmol/L (ref 20–31)
CREATININE: 0.74 mg/dL (ref 0.50–1.05)
Chloride: 103 mmol/L (ref 98–110)
GFR, Est Non African American: >89 mL/min (ref >=60)
Glucose, Bld: 92 mg/dL (ref 65–99)
Potassium: 4.5 mmol/L (ref 3.5–5.3)
Sodium: 141 mmol/L (ref 135–146)
TOTAL PROTEIN: 7.3 g/dL (ref 6.1–8.1)

## 2016-01-10 NOTE — Assessment & Plan Note (Signed)
USPSTF grade A and B recommendations reviewed with patient; age-appropriate recommendations, preventive care, screening tests, etc discussed and encouraged; healthy living encouraged; see AVS for patient education given to patient  

## 2016-01-10 NOTE — Assessment & Plan Note (Signed)
Request records, has hearing aide

## 2016-01-10 NOTE — Assessment & Plan Note (Signed)
mammo order

## 2016-01-10 NOTE — Patient Instructions (Addendum)
Health Maintenance, Female Adopting a healthy lifestyle and getting preventive care can go a long way to promote health and wellness. Talk with your health care provider about what schedule of regular examinations is right for you. This is a good chance for you to check in with your provider about disease prevention and staying healthy. In between checkups, there are plenty of things you can do on your own. Experts have done a lot of research about which lifestyle changes and preventive measures are most likely to keep you healthy. Ask your health care provider for more information. WEIGHT AND DIET  Eat a healthy diet  Be sure to include plenty of vegetables, fruits, low-fat dairy products, and lean protein.  Do not eat a lot of foods high in solid fats, added sugars, or salt.  Get regular exercise. This is one of the most important things you can do for your health.  Most adults should exercise for at least 150 minutes each week. The exercise should increase your heart rate and make you sweat (moderate-intensity exercise).  Most adults should also do strengthening exercises at least twice a week. This is in addition to the moderate-intensity exercise.  Maintain a healthy weight  Body mass index (BMI) is a measurement that can be used to identify possible weight problems. It estimates body fat based on height and weight. Your health care provider can help determine your BMI and help you achieve or maintain a healthy weight.  For females 20 years of age and older:   A BMI below 18.5 is considered underweight.  A BMI of 18.5 to 24.9 is normal.  A BMI of 25 to 29.9 is considered overweight.  A BMI of 30 and above is considered obese.  Watch levels of cholesterol and blood lipids  You should start having your blood tested for lipids and cholesterol at 52 years of age, then have this test every 5 years.  You may need to have your cholesterol levels checked more often if:  Your lipid  or cholesterol levels are high.  You are older than 52 years of age.  You are at high risk for heart disease.  CANCER SCREENING   Lung Cancer  Lung cancer screening is recommended for adults 55-80 years old who are at high risk for lung cancer because of a history of smoking.  A yearly low-dose CT scan of the lungs is recommended for people who:  Currently smoke.  Have quit within the past 15 years.  Have at least a 30-pack-year history of smoking. A pack year is smoking an average of one pack of cigarettes a day for 1 year.  Yearly screening should continue until it has been 15 years since you quit.  Yearly screening should stop if you develop a health problem that would prevent you from having lung cancer treatment.  Breast Cancer  Practice breast self-awareness. This means understanding how your breasts normally appear and feel.  It also means doing regular breast self-exams. Let your health care provider know about any changes, no matter how small.  If you are in your 20s or 30s, you should have a clinical breast exam (CBE) by a health care provider every 1-3 years as part of a regular health exam.  If you are 40 or older, have a CBE every year. Also consider having a breast X-ray (mammogram) every year.  If you have a family history of breast cancer, talk to your health care provider about genetic screening.  If you   are at high risk for breast cancer, talk to your health care provider about having an MRI and a mammogram every year.  Breast cancer gene (BRCA) assessment is recommended for women who have family members with BRCA-related cancers. BRCA-related cancers include:  Breast.  Ovarian.  Tubal.  Peritoneal cancers.  Results of the assessment will determine the need for genetic counseling and BRCA1 and BRCA2 testing. Cervical Cancer Your health care provider may recommend that you be screened regularly for cancer of the pelvic organs (ovaries, uterus, and  vagina). This screening involves a pelvic examination, including checking for microscopic changes to the surface of your cervix (Pap test). You may be encouraged to have this screening done every 3 years, beginning at age 16.  For women ages 51-65, health care providers may recommend pelvic exams and Pap testing every 3 years, or they may recommend the Pap and pelvic exam, combined with testing for human papilloma virus (HPV), every 5 years. Some types of HPV increase your risk of cervical cancer. Testing for HPV may also be done on women of any age with unclear Pap test results.  Other health care providers may not recommend any screening for nonpregnant women who are considered low risk for pelvic cancer and who do not have symptoms. Ask your health care provider if a screening pelvic exam is right for you.  If you have had past treatment for cervical cancer or a condition that could lead to cancer, you need Pap tests and screening for cancer for at least 20 years after your treatment. If Pap tests have been discontinued, your risk factors (such as having a new sexual partner) need to be reassessed to determine if screening should resume. Some women have medical problems that increase the chance of getting cervical cancer. In these cases, your health care provider may recommend more frequent screening and Pap tests. Colorectal Cancer  This type of cancer can be detected and often prevented.  Routine colorectal cancer screening usually begins at 52 years of age and continues through 52 years of age.  Your health care provider may recommend screening at an earlier age if you have risk factors for colon cancer.  Your health care provider may also recommend using home test kits to check for hidden blood in the stool.  A small camera at the end of a tube can be used to examine your colon directly (sigmoidoscopy or colonoscopy). This is done to check for the earliest forms of colorectal  cancer.  Routine screening usually begins at age 39.  Direct examination of the colon should be repeated every 5-10 years through 52 years of age. However, you may need to be screened more often if early forms of precancerous polyps or small growths are found. Skin Cancer  Check your skin from head to toe regularly.  Tell your health care provider about any new moles or changes in moles, especially if there is a change in a mole's shape or color.  Also tell your health care provider if you have a mole that is larger than the size of a pencil eraser.  Always use sunscreen. Apply sunscreen liberally and repeatedly throughout the day.  Protect yourself by wearing long sleeves, pants, a wide-brimmed hat, and sunglasses whenever you are outside. HEART DISEASE, DIABETES, AND HIGH BLOOD PRESSURE   High blood pressure causes heart disease and increases the risk of stroke. High blood pressure is more likely to develop in:  People who have blood pressure in the high end  of the normal range (130-139/85-89 mm Hg).  People who are overweight or obese.  People who are African American.  If you are 38-23 years of age, have your blood pressure checked every 3-5 years. If you are 61 years of age or older, have your blood pressure checked every year. You should have your blood pressure measured twice--once when you are at a hospital or clinic, and once when you are not at a hospital or clinic. Record the average of the two measurements. To check your blood pressure when you are not at a hospital or clinic, you can use:  An automated blood pressure machine at a pharmacy.  A home blood pressure monitor.  If you are between 45 years and 39 years old, ask your health care provider if you should take aspirin to prevent strokes.  Have regular diabetes screenings. This involves taking a blood sample to check your fasting blood sugar level.  If you are at a normal weight and have a low risk for diabetes,  have this test once every three years after 52 years of age.  If you are overweight and have a high risk for diabetes, consider being tested at a younger age or more often. PREVENTING INFECTION  Hepatitis B  If you have a higher risk for hepatitis B, you should be screened for this virus. You are considered at high risk for hepatitis B if:  You were born in a country where hepatitis B is common. Ask your health care provider which countries are considered high risk.  Your parents were born in a high-risk country, and you have not been immunized against hepatitis B (hepatitis B vaccine).  You have HIV or AIDS.  You use needles to inject street drugs.  You live with someone who has hepatitis B.  You have had sex with someone who has hepatitis B.  You get hemodialysis treatment.  You take certain medicines for conditions, including cancer, organ transplantation, and autoimmune conditions. Hepatitis C  Blood testing is recommended for:  Everyone born from [birth year] through 1967.  Anyone with known risk factors for hepatitis C. Sexually transmitted infections (STIs)  You should be screened for sexually transmitted infections (STIs) including gonorrhea and chlamydia if:  You are sexually active and are younger than 52 years of age.  You are older than 53 years of age and your health care provider tells you that you are at risk for this type of infection.  Your sexual activity has changed since you were last screened and you are at an increased risk for chlamydia or gonorrhea. Ask your health care provider if you are at risk.  If you do not have HIV, but are at risk, it may be recommended that you take a prescription medicine daily to prevent HIV infection. This is called pre-exposure prophylaxis (PrEP). You are considered at risk if:  You are sexually active and do not regularly use condoms or know the HIV status of your partner(s).  You take drugs by injection.  You are sexually  active with a partner who has HIV. Talk with your health care provider about whether you are at high risk of being infected with HIV. If you choose to begin PrEP, you should first be tested for HIV. You should then be tested every 3 months for as long as you are taking PrEP.  PREGNANCY   If you are premenopausal and you may become pregnant, ask your health care provider about preconception counseling.  If you may  become pregnant, take 400 to 800 micrograms (mcg) of folic acid every day.  If you want to prevent pregnancy, talk to your health care provider about birth control (contraception). OSTEOPOROSIS AND MENOPAUSE   Osteoporosis is a disease in which the bones lose minerals and strength with aging. This can result in serious bone fractures. Your risk for osteoporosis can be identified using a bone density scan.  If you are 68 years of age or older, or if you are at risk for osteoporosis and fractures, ask your health care provider if you should be screened.  Ask your health care provider whether you should take a calcium or vitamin D supplement to lower your risk for osteoporosis.  Menopause may have certain physical symptoms and risks.  Hormone replacement therapy may reduce some of these symptoms and risks. Talk to your health care provider about whether hormone replacement therapy is right for you.  HOME CARE INSTRUCTIONS   Schedule regular health, dental, and eye exams.  Stay current with your immunizations.   Do not use any tobacco products including cigarettes, chewing tobacco, or electronic cigarettes.  If you are pregnant, do not drink alcohol.  If you are breastfeeding, limit how much and how often you drink alcohol.  Limit alcohol intake to no more than 1 drink per day for nonpregnant women. One drink equals 12 ounces of beer, 5 ounces of wine, or 1 ounces of hard liquor.  Do not use street drugs.  Do not share needles.  Ask your health care provider for help if  you need support or information about quitting drugs.  Tell your health care provider if you often feel depressed.  Tell your health care provider if you have ever been abused or do not feel safe at home.   This information is not intended to replace advice given to you by your health care provider. Make sure you discuss any questions you have with your health care provider.   Document Released: 10/31/2010 Document Revised: 05/08/2014 Document Reviewed: 03/19/2013 Elsevier Interactive Patient Education [employer].

## 2016-01-10 NOTE — Assessment & Plan Note (Signed)
today

## 2016-01-10 NOTE — Progress Notes (Signed)
Patient ID: Stephanie Pineda, female   DOB: 10-06-1965, 50 y.o.   MRN: [id number]   Subjective:   Stephanie Pineda is a 50 y.o. 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 Anda Latina; has a hearing aide on the right; still a little soreness with prlonged sitting, saw Dr. Rudene Christians, but it is getting better  USPSTF grade A and B recommendations Alcohol: occasional, 1 bottle of wine a month Depression:  Depression screen [facility] 2/9 01/10/2016 01/05/2015  Decreased Interest 0 0  Down, Depressed, Hopeless 0 0  PHQ - 2 Score 0 0   Hypertension: controlled Obesity: gaining even with working out Tobacco use: former smoker, quit 9 years HIV, hep B, hep C: not interested STD testing and prevention (chl/gon/syphilis): politely declined Lipids: check, fasting Glucose: check, fasting Colorectal cancer: UTD, done at age 61 Breast cancer: UTD, no lumps, mammo ordered BRCA gene screening: no fam hx Intimate partner violence:no abuse Cervical cancer screening: doing one today; last 3 years; remote abnormal; had cryo back in the 1980s or laser Diet: trying to eat better Exercise: working out, more than 150 minutes per week, 2 hours five days a week Skin cancer: no worrisome moles  Past Medical History:  Diagnosis Date  . Arthritis    right hip, left knee  . Bruised    Pelvic bone - right side  . Family history of adverse reaction to anesthesia    Brother "flat-lined" during appendectomy > 20 yrs ago. Has had stents placed since without issues.  Marland Kitchen Heart murmur    followed by PCP  . History of pneumonia 1983  . Hypercholesteremia   . Obesity    Past Surgical History:  Procedure Laterality Date  . COLONOSCOPY WITH PROPOFOL N/A 02/18/2015   Procedure: COLONOSCOPY WITH PROPOFOL;  Surgeon: Lucilla Lame, MD;  Location: [place];  Service: Endoscopy;   Laterality: N/A;  . ENDOMETRIAL ABLATION  2009   laser surgery for Dysplasia  . ESOPHAGOGASTRODUODENOSCOPY    . TUBAL LIGATION     Family History  Problem Relation Age of Onset  . Asthma Mother   . Hyperlipidemia Mother   . Thyroid disease Mother   . COPD Mother   . Osteoporosis Mother   . Heart disease Mother   . Hyperlipidemia Father   . Heart disease Father   . Hypertension Father   . COPD Father   . Glaucoma Father   . Cancer Sister     melanoma  . Cancer Brother     lymphoma  . Diabetes Brother   . Heart disease Brother   . Hypertension Brother   . Thyroid disease Daughter   . Diabetes Maternal Grandmother    Social History  Substance Use Topics  . Smoking status: Former Smoker    Packs/day: 1.00    Years: 20.00    Types: Cigarettes    Quit date: 05/01/2006  . Smokeless tobacco: Never Used  . Alcohol use Yes     Comment: 2 a month   Review of Systems  Constitutional: Positive for unexpected weight change (gaining even though exercising).  HENT: Positive for hearing loss (right).   Respiratory: Negative for wheezing.   Cardiovascular: Negative for chest pain and leg swelling.  Gastrointestinal: Negative for blood in stool.  Endocrine: Positive for heat intolerance. Negative for polydipsia and polyuria.  Genitourinary: Negative for hematuria.  Musculoskeletal: Positive for arthralgias (hip and knee).  Hematological: Negative for adenopathy. Does not bruise/bleed easily.  Psychiatric/Behavioral: Negative for dysphoric mood.   Objective:   Vitals:   01/10/16 1102  BP: 116/74  Pulse: 68  Resp: 14  Temp: 98.2 F (36.8 C)  TempSrc: Oral  SpO2: 96%  Weight: 177 lb 9.6 oz (80.6 kg)   Body mass index is 31.71 kg/m. Wt Readings from Last 3 Encounters:  01/10/16 177 lb 9.6 oz (80.6 kg)  02/18/15 168 lb (76.2 kg)  01/05/15 168 lb (76.2 kg)   Physical Exam  Constitutional: She appears well-developed and well-nourished.  HENT:  Head: Normocephalic and  atraumatic.  Eyes: Conjunctivae and EOM are normal. Right eye exhibits no hordeolum. Left eye exhibits no hordeolum. No scleral icterus.  Neck: Carotid bruit is not present. No thyromegaly present.  Cardiovascular: Normal rate, regular rhythm, S1 normal, S2 normal and normal heart sounds.   No extrasystoles are present.  Pulmonary/Chest: Effort normal and breath sounds normal. No respiratory distress. Right breast exhibits no inverted nipple, no mass, no nipple discharge, no skin change and no tenderness. Left breast exhibits no inverted nipple, no mass, no nipple discharge, no skin change and no tenderness. Breasts are symmetrical.  Abdominal: Soft. Normal appearance and bowel sounds are normal. She exhibits no distension, no abdominal bruit, no pulsatile midline mass and no mass. There is no hepatosplenomegaly. There is no tenderness. No hernia.  Genitourinary: Uterus normal. Pelvic exam was performed with patient prone. There is no rash or lesion on the right labia. There is no rash or lesion on the left labia. Cervix exhibits no motion tenderness. Right adnexum displays no mass, no tenderness and no fullness. Left adnexum displays no mass, no tenderness and no fullness.  Musculoskeletal: Normal range of motion. She exhibits no edema.  Lymphadenopathy:       Head (right side): No submandibular adenopathy present.       Head (left side): No submandibular adenopathy present.    She has no cervical adenopathy.    She has no axillary adenopathy.  Neurological: She is alert. She displays no tremor. No cranial nerve deficit. She exhibits normal muscle tone. Gait normal.  Skin: Skin is warm and dry. No bruising and no ecchymosis noted. No cyanosis. No pallor.  Psychiatric: Her speech is normal and behavior is normal. Thought content normal. Her mood appears not anxious. She does not exhibit a depressed mood.   Assessment/Plan:   Problem List Items Addressed This Visit      Nervous and Auditory    Vestibular schwannoma [facility])    Request records, has hearing aide        Other   Screening for cervical cancer    today      Relevant Orders   Pap liquid-based and HPV (high risk)   Pap, ThinPrep rflx HPV   Preventative health care    USPSTF grade A and B recommendations reviewed with patient; age-appropriate recommendations, preventive care, screening tests, etc discussed and encouraged; healthy living encouraged; see AVS for patient education given to patient      Relevant Orders   CBC with Differential/Platelet   Lipid panel   COMPLETE METABOLIC PANEL WITH GFR   TSH   Breast cancer screening    mammo order      Relevant Orders   MM DIGITAL SCREENING BILATERAL    Other Visit Diagnoses   None.      Meds ordered this encounter  Medications  . gentamicin ointment (GARAMYCIN) 0.1 %   Orders Placed This Encounter  Procedures  . MM DIGITAL SCREENING BILATERAL    Standing Status:   Future    Standing Expiration Date:   01/09/2017    Order Specific Question:   Reason for Exam (SYMPTOM  OR DIAGNOSIS REQUIRED)    Answer:   screening    Order Specific Question:   Is the patient pregnant?    Answer:   No    Order Specific Question:   Preferred imaging location?    Answer:   [facility]  . CBC with Differential/Platelet  . Lipid panel  . COMPLETE METABOLIC PANEL WITH GFR  . TSH   Follow up plan: Return in about 1 year (around 01/09/2017) for complete physical.  An After Visit Summary was printed and given to the patient.

## 2016-01-11 LAB — TSH: TSH: 1 mIU/L

## 2016-01-12 LAB — PAP, THINPREP RFLX HPV

## 2016-01-13 ENCOUNTER — Other Ambulatory Visit: Payer: Self-pay | Admitting: Family Medicine

## 2016-01-13 DIAGNOSIS — Z124 Encounter for screening for malignant neoplasm of cervix: Secondary | ICD-10-CM

## 2016-01-13 NOTE — Progress Notes (Signed)
Added new order if needed, need HPV status on pap

## 2016-01-13 NOTE — Assessment & Plan Note (Signed)
Pap smear

## 2016-01-14 ENCOUNTER — Other Ambulatory Visit: Payer: Self-pay

## 2016-01-14 ENCOUNTER — Encounter: Payer: Self-pay | Admitting: Family Medicine

## 2016-01-14 DIAGNOSIS — Z124 Encounter for screening for malignant neoplasm of cervix: Secondary | ICD-10-CM

## 2016-01-14 NOTE — Progress Notes (Signed)
Letter re: labs 

## 2016-01-19 ENCOUNTER — Telehealth: Payer: Self-pay

## 2016-01-19 NOTE — Telephone Encounter (Signed)
Please let pt know that her labs overall look pretty good Her total cholesterol is a little high, but a lot of that is healthy cholesterol; always try to limit bacon and sausage and hamburgers and fried foods and cheese A new pap smear report was sent, but I still don't see HPV results (I may just be missing it, but I really did not see it on the new report -- in the fax/scan tray); please check again; thank you

## 2016-01-19 NOTE — Telephone Encounter (Signed)
Pt.notified

## 2016-01-19 NOTE — Telephone Encounter (Signed)
Please review labs. 

## 2016-06-08 ENCOUNTER — Other Ambulatory Visit: Payer: Self-pay | Admitting: Unknown Physician Specialty

## 2016-06-08 DIAGNOSIS — H905 Unspecified sensorineural hearing loss: Secondary | ICD-10-CM

## 2016-08-07 ENCOUNTER — Ambulatory Visit: Payer: Self-pay | Admitting: Physician Assistant

## 2016-08-07 ENCOUNTER — Encounter: Payer: Self-pay | Admitting: Physician Assistant

## 2016-08-07 VITALS — BP 110/79 | HR 61 | Temp 97.8°F | Ht 63.0 in | Wt 173.0 lb

## 2016-08-07 DIAGNOSIS — R7989 Other specified abnormal findings of blood chemistry: Secondary | ICD-10-CM

## 2016-08-07 DIAGNOSIS — Z0000 Encounter for general adult medical examination without abnormal findings: Secondary | ICD-10-CM

## 2016-08-07 NOTE — Progress Notes (Signed)
S: pt here for wellness physical, had biometrics for insurance purposes at work, no complaints ros neg. pcp is Dr Sanda Klein, saw her in September, has not gotten her mammogram completed,  PMH:  As noted on chart Social: as noted on chart  Fam: as noted on chart  O: vitals wnl, nad, ENT wnl, neck supple no lymph, lungs c t a, cv rrr, abd soft nontender bs normal all 4 quads  A: wellness exam for insurance purproses  P:  Recheck of cbc, thyroid panel, screening for hiv, hep c ; pt to call and set up appt for  Mammogram, f/u w/ dr lada as needed

## 2016-08-08 LAB — CBC WITH DIFFERENTIAL/PLATELET
BASOS: 1 %
Basophils Absolute: 0 10*3/uL (ref 0.0–0.2)
EOS (ABSOLUTE): 0.1 10*3/uL (ref 0.0–0.4)
EOS: 2 %
HEMATOCRIT: 39.1 % (ref 34.0–46.6)
Hemoglobin: 13.5 g/dL (ref 11.1–15.9)
IMMATURE GRANS (ABS): 0 10*3/uL (ref 0.0–0.1)
IMMATURE GRANULOCYTES: 0 %
LYMPHS: 41 %
Lymphocytes Absolute: 1.3 10*3/uL (ref 0.7–3.1)
MCH: 29.7 pg (ref 26.6–33.0)
MCHC: 34.5 g/dL (ref 31.5–35.7)
MCV: 86 fL (ref 79–97)
Monocytes Absolute: 0.3 10*3/uL (ref 0.1–0.9)
Monocytes: 8 %
Neutrophils Absolute: 1.6 10*3/uL (ref 1.4–7.0)
Neutrophils: 48 %
Platelets: 246 10*3/uL (ref 150–379)
RBC: 4.54 x10E6/uL (ref 3.77–5.28)
RDW: 14 % (ref 12.3–15.4)
WBC: 3.3 10*3/uL — ABNORMAL LOW (ref 3.4–10.8)

## 2016-08-08 LAB — THYROID PANEL WITH TSH
FREE THYROXINE INDEX: 2 (ref 1.2–4.9)
T3 UPTAKE RATIO: 26 % (ref 24–39)
T4 TOTAL: 7.6 ug/dL (ref 4.5–12.0)
TSH: 1.46 u[IU]/mL (ref 0.450–4.500)

## 2016-08-08 LAB — HCV COMMENT:

## 2016-08-08 LAB — HIV ANTIBODY (ROUTINE TESTING W REFLEX): HIV SCREEN 4TH GENERATION: NONREACTIVE

## 2016-08-08 LAB — HEPATITIS C ANTIBODY (REFLEX)

## 2016-08-10 NOTE — Addendum Note (Signed)
Addended by: Rudene Anda T on: 08/10/2016 09:50 AM   Modules accepted: Orders

## 2016-08-25 ENCOUNTER — Encounter: Payer: Self-pay | Admitting: Oncology

## 2016-08-25 ENCOUNTER — Inpatient Hospital Stay: Payer: Managed Care, Other (non HMO)

## 2016-08-25 ENCOUNTER — Inpatient Hospital Stay: Payer: Managed Care, Other (non HMO) | Attending: Oncology | Admitting: Oncology

## 2016-08-25 VITALS — BP 130/85 | HR 67 | Temp 96.9°F | Resp 18 | Ht 64.57 in | Wt 172.2 lb

## 2016-08-25 DIAGNOSIS — I10 Essential (primary) hypertension: Secondary | ICD-10-CM | POA: Diagnosis not present

## 2016-08-25 DIAGNOSIS — Z808 Family history of malignant neoplasm of other organs or systems: Secondary | ICD-10-CM | POA: Diagnosis not present

## 2016-08-25 DIAGNOSIS — Z87891 Personal history of nicotine dependence: Secondary | ICD-10-CM

## 2016-08-25 DIAGNOSIS — D333 Benign neoplasm of cranial nerves: Secondary | ICD-10-CM

## 2016-08-25 DIAGNOSIS — Z6825 Body mass index (BMI) 25.0-25.9, adult: Secondary | ICD-10-CM

## 2016-08-25 DIAGNOSIS — E7800 Pure hypercholesterolemia, unspecified: Secondary | ICD-10-CM

## 2016-08-25 DIAGNOSIS — E669 Obesity, unspecified: Secondary | ICD-10-CM

## 2016-08-25 DIAGNOSIS — D72819 Decreased white blood cell count, unspecified: Secondary | ICD-10-CM

## 2016-08-25 DIAGNOSIS — Z806 Family history of leukemia: Secondary | ICD-10-CM

## 2016-08-25 LAB — CBC WITH DIFFERENTIAL/PLATELET
BASOS ABS: 0 10*3/uL (ref 0–0.1)
BASOS PCT: 1 %
EOS ABS: 0.1 10*3/uL (ref 0–0.7)
EOS PCT: 1 %
HCT: 37.9 % (ref 35.0–47.0)
Hemoglobin: 13.1 g/dL (ref 12.0–16.0)
LYMPHS ABS: 1.8 10*3/uL (ref 1.0–3.6)
LYMPHS PCT: 37 %
MCH: 29.2 pg (ref 26.0–34.0)
MCHC: 34.6 g/dL (ref 32.0–36.0)
MCV: 84.6 fL (ref 80.0–100.0)
Monocytes Absolute: 0.4 10*3/uL (ref 0.2–0.9)
Monocytes Relative: 9 %
NEUTROS PCT: 52 %
Neutro Abs: 2.6 10*3/uL (ref 1.4–6.5)
PLATELETS: 247 10*3/uL (ref 150–440)
RBC: 4.47 MIL/uL (ref 3.80–5.20)
RDW: 13.7 % (ref 11.5–14.5)
WBC: 4.9 10*3/uL (ref 3.6–11.0)

## 2016-08-25 LAB — PATHOLOGIST SMEAR REVIEW

## 2016-08-25 NOTE — Progress Notes (Signed)
Called the patient and leftmesage that cbc was normal and she can f/u with PCP,call if any questions

## 2016-08-25 NOTE — Progress Notes (Addendum)
Hematology/Oncology Consult note [facility] [phone] [phone]  Patient Care Team: Arnetha Courser, MD as PCP - General (Family Medicine) Beverly Gust, MD (Otolaryngology) Hessie Knows, MD as Consulting Physician (Orthopedic Surgery)   Name of the patient: Stephanie Pineda  [id number]  1965/08/17    Reason for referral- leucopenia   Referring physician- Dr. Sanda Klein  Date of visit: 08/25/16   History of presenting illness- patient is a 51 year old female who was been referred to [place] for leukopenia. Recent TSH and HIV Hep C testing from 08/07/16 unremarkable. Trend of her CBC is as follows:  Component     Latest Ref Rng & Units 01/05/2015 01/10/2016 08/07/2016  WBC     3.4 - 10.8 x10E3/uL 5.0 4.7 3.3 (L)  RBC     3.77 - 5.28 x10E6/uL 4.27 4.59 4.54  Hemoglobin     11.1 - 15.9 g/dL 12.6 13.4 13.5  HCT     34.0 - 46.6 %Within  37.0 39.9 39.1  MCV     79 - 97 fL 87 86.9 86  MCH     26.6 - 33.0 pg 29.5 29.2 29.7  MCHC     31.5 - 35.7 g/dL 34.1 33.6 34.5  RDW     12.3 - 15.4 % 14.1 13.7 14.0  Platelets     150 - 379 x10E3/uL 224 230 52    Patient's father has leukemia but she is not sure which type. She reports doing well and does not take any medications other than occasional Aleve. Denies any over-the-counter supplements or herbal medications or herbal teas. She denies any appetite loss or unintentional weight loss. Denies any recurrent infections or hospitalizations.  ECOG PS- 0  Pain scale- 0  Review of systems- Review of Systems  Constitutional: Negative for chills, fever, malaise/fatigue and weight loss.  HENT: Negative for congestion, ear discharge and nosebleeds.   Eyes: Negative for blurred vision.  Respiratory: Negative for cough, hemoptysis, sputum production, shortness of breath and wheezing.   Cardiovascular: Negative for chest pain, palpitations, orthopnea and claudication.  Gastrointestinal: Negative for abdominal  pain, blood in stool, constipation, diarrhea, heartburn, melena, nausea and vomiting.  Genitourinary: Negative for dysuria, flank pain, frequency, hematuria and urgency.  Musculoskeletal: Negative for back pain, joint pain and myalgias.  Skin: Negative for rash.  Neurological: Negative for dizziness, tingling, focal weakness, seizures, weakness and headaches.  Endo/Heme/Allergies: Does not bruise/bleed easily.  Psychiatric/Behavioral: Negative for depression and suicidal ideas. The patient does not have insomnia.     No Known Allergies  Patient Active Problem List   Diagnosis Date Noted  . Vestibular schwannoma ([place]) 01/10/2016  . Screening for cervical cancer 01/10/2016  . Special screening for malignant neoplasms, colon   . Colon cancer screening 01/05/2015  . Overweight (BMI 25.0-29.9) 01/05/2015  . Elevated blood pressure 01/05/2015  . Breast cancer screening 01/05/2015  . Preventative health care 01/05/2015  . Hypercholesteremia      Past Medical History:  Diagnosis Date  . Arthritis    right hip, left knee  . Bruised    Pelvic bone - right side  . Cervical cancer ([place])    1986   laser surg tx per pt  . Family history of adverse reaction to anesthesia    Brother "flat-lined" during appendectomy > 20 yrs ago. Has had stents placed since without issues.  Marland Kitchen Heart murmur    followed by PCP  . History of pneumonia 1983  . Hypercholesteremia   . Obesity  Past Surgical History:  Procedure Laterality Date  . COLONOSCOPY WITH PROPOFOL N/A 02/18/2015   Procedure: COLONOSCOPY WITH PROPOFOL;  Surgeon: Lucilla Lame, MD;  Location: [place];  Service: Endoscopy;  Laterality: N/A;  . ENDOMETRIAL ABLATION  2009   laser surgery for Dysplasia  . ESOPHAGOGASTRODUODENOSCOPY    . TUBAL LIGATION      Social History   Social History  . Marital status: Married    Spouse name: N/A  . Number of children: N/A  . Years of education: N/A   Occupational History  .  Not on file.   Social History Main Topics  . Smoking status: Former Smoker    Packs/day: 1.00    Years: 20.00    Types: Cigarettes    Quit date: 05/01/2006  . Smokeless tobacco: Never Used  . Alcohol use Yes     Comment: 2 a month  . Drug use: No  . Sexual activity: No   Other Topics Concern  . Not on file   Social History Narrative  . No narrative on file     Family History  Problem Relation Age of Onset  . Asthma Mother   . Hyperlipidemia Mother   . Thyroid disease Mother   . COPD Mother   . Osteoporosis Mother   . Heart disease Mother   . Hyperlipidemia Father   . Heart disease Father   . Hypertension Father   . COPD Father   . Glaucoma Father   . Cancer Sister     melanoma  . Diabetes Brother   . Heart disease Brother   . Hypertension Brother   . Thyroid disease Daughter   . Diabetes Maternal Grandmother   . Lymphoma Brother   he   Current Outpatient Prescriptions:  .  clobetasol cream (TEMOVATE) 2.69 %, Apply 1 application topically 2 (two) times daily as needed. Too strong for face, underarms, groin (Patient not taking: Reported on 08/07/2016), Disp: 30 g, Rfl: 1 .  gentamicin ointment (GARAMYCIN) 0.1 %, , Disp: , Rfl:    Physical exam:  Vitals:   08/25/16 1432  BP: 130/85  Pulse: 67  Resp: 18  Temp: (!) 96.9 F (36.1 C)  TempSrc: Tympanic  Weight: 172 lb 3.2 oz (78.1 kg)  Height: 5' 4.57" (1.64 m)   Physical Exam  Constitutional: She is oriented to person, place, and time and well-developed, well-nourished, and in no distress.  HENT:  Head: Normocephalic and atraumatic.  Eyes: EOM are normal. Pupils are equal, round, and reactive to light.  Neck: Normal range of motion.  Cardiovascular: Normal rate, regular rhythm and normal heart sounds.   Pulmonary/Chest: Effort normal and breath sounds normal.  Abdominal: Soft. Bowel sounds are normal.  Lymphadenopathy:  No palpable cervical, supraclavicular or inguinal adenopathy  Neurological: She is  alert and oriented to person, place, and time.  Skin: Skin is warm and dry.       CMP Latest Ref Rng & Units 01/10/2016  Glucose 65 - 99 mg/dL 92  BUN 7 - 25 mg/dL 12  Creatinine 0.50 - 1.05 mg/dL 0.74  Sodium 135 - 146 mmol/L 141  Potassium 3.5 - 5.3 mmol/L 4.5  Chloride 98 - 110 mmol/L 103  CO2 20 - 31 mmol/L 30  Calcium 8.6 - 10.4 mg/dL 9.7  Total Protein 6.1 - 8.1 g/dL 7.3  Total Bilirubin 0.2 - 1.2 mg/dL 0.4  Alkaline Phos 33 - 130 U/L 53  AST 10 - 35 U/L 21  ALT 6 -  29 U/L 18   CBC Latest Ref Rng & Units 08/07/2016  WBC 3.4 - 10.8 x10E3/uL 3.3(L)  Hemoglobin 11.7 - 15.5 g/dL -  Hematocrit 34.0 - 46.6 % 39.1  Platelets 150 - 379 x10E3/uL 246    No images are attached to the encounter.  No results found.  Assessment and plan- Patient is a 51 y.o. female referred to [place] for mild leucopenia with normal differential.  Patient had 1 isolated wbc of 3.3. Prior counts have been normal. HIV hep c testing were negative. I will obtain repeat cbc with diff and pathology review of her smear. We will call patient with blood work results and decide about further work up based on that   Thank you for this kind referral and the opportunity to participate in the care of this patient   Visit Diagnosis 1. Leukopenia, unspecified type     Dr. Randa Evens, MD, MPH [place] at [facility] Pager- [phone] 08/25/2016    Addendum: repeat cbc done today was:  CBC    Component Value Date/Time   WBC 4.9 08/25/2016 1453   RBC 4.47 08/25/2016 1453   HGB 13.1 08/25/2016 1453   HCT 37.9 08/25/2016 1453   HCT 39.1 08/07/2016 0904   PLT 247 08/25/2016 1453   PLT 246 08/07/2016 0904   MCV 84.6 08/25/2016 1453   MCV 86 08/07/2016 0904   MCH 29.2 08/25/2016 1453   MCHC 34.6 08/25/2016 1453   RDW 13.7 08/25/2016 1453   RDW 14.0 08/07/2016 0904   LYMPHSABS 1.8 08/25/2016 1453   LYMPHSABS 1.3 08/07/2016 0904   MONOABS 0.4 08/25/2016 1453   EOSABS 0.1 08/25/2016 1453    EOSABS 0.1 08/07/2016 0904   BASOSABS 0.0 08/25/2016 1453   BASOSABS 0.0 08/07/2016 0904     Since it is normal with normal differential, she can continue to f/u with Dr. Sanda Klein and does not need f/u with [place]  Dr. Randa Evens, MD, MPH [place] at [facility] Pager- [phone] 08/25/2016 3:17 PM

## 2016-08-25 NOTE — Progress Notes (Signed)
Please inform patient that her cbc is normal. She can f/u with her pcp and she can be referred to [place] if she has persistent leucopenia in the future

## 2016-08-25 NOTE — Progress Notes (Signed)
Here  for new pt eval  

## 2016-11-20 ENCOUNTER — Ambulatory Visit: Payer: Self-pay | Admitting: Physician Assistant

## 2016-11-20 ENCOUNTER — Ambulatory Visit
Admission: RE | Admit: 2016-11-20 | Discharge: 2016-11-20 | Disposition: A | Discharge status: Home / Self Care | Payer: Managed Care, Other (non HMO) | Source: Ambulatory Visit | Attending: Unknown Physician Specialty | Admitting: Unknown Physician Specialty

## 2016-11-20 DIAGNOSIS — Z299 Encounter for prophylactic measures, unspecified: Secondary | ICD-10-CM

## 2016-11-20 DIAGNOSIS — H905 Unspecified sensorineural hearing loss: Secondary | ICD-10-CM | POA: Diagnosis not present

## 2016-11-20 MED ORDER — GADOBENATE DIMEGLUMINE 529 MG/ML IV SOLN
15.0000 mL | Freq: Once | INTRAVENOUS | Status: AC | PRN
  Administered 2016-11-20: 15 mL via INTRAVENOUS

## 2016-11-20 NOTE — Progress Notes (Signed)
Pt here for tdap

## 2016-11-30 ENCOUNTER — Ambulatory Visit
Admission: RE | Admit: 2016-11-30 | Discharge: 2016-11-30 | Disposition: A | Discharge status: Home / Self Care | Payer: Managed Care, Other (non HMO) | Source: Ambulatory Visit | Attending: Family Medicine | Admitting: Family Medicine

## 2016-11-30 DIAGNOSIS — Z1231 Encounter for screening mammogram for malignant neoplasm of breast: Secondary | ICD-10-CM | POA: Insufficient documentation

## 2016-11-30 DIAGNOSIS — Z1239 Encounter for other screening for malignant neoplasm of breast: Secondary | ICD-10-CM

## 2017-01-12 ENCOUNTER — Encounter: Payer: 59 | Admitting: Family Medicine

## 2017-02-26 ENCOUNTER — Encounter: Payer: 59 | Admitting: Family Medicine

## 2017-03-16 ENCOUNTER — Encounter: Payer: Self-pay | Admitting: Physician Assistant

## 2017-03-16 ENCOUNTER — Ambulatory Visit: Payer: Self-pay | Admitting: Physician Assistant

## 2017-03-16 VITALS — BP 110/79 | HR 75 | Temp 98.6°F | Resp 16

## 2017-03-16 DIAGNOSIS — J0100 Acute maxillary sinusitis, unspecified: Secondary | ICD-10-CM

## 2017-03-16 MED ORDER — AZITHROMYCIN 250 MG PO TABS: ORAL_TABLET | ORAL | 0 refills | Status: DC

## 2017-03-16 NOTE — Progress Notes (Signed)
S: Patient complains of a sinus headache for several days, slight cough and congestion, took Excedrin yesterday for the headache which did not help, no fever or chills, no chest pain or shortness of breath, her husband was sick with the same symptoms last week  O: Vitals are normal, TMs are dull, nasal mucosa is red and swollen, throat is normal, neck is supple, no lymphadenopathy noted, lungs are clear to auscultation, heart sounds are normal,  A: acute sinusitis  P: zpack

## 2017-05-02 ENCOUNTER — Ambulatory Visit: Payer: Self-pay

## 2017-05-02 VITALS — BP 110/70 | HR 85 | Temp 98.5°F | Resp 16

## 2017-05-02 DIAGNOSIS — Z299 Encounter for prophylactic measures, unspecified: Secondary | ICD-10-CM

## 2017-05-02 NOTE — Progress Notes (Unsigned)
Patient came in to have her scheduled biometric screening and labs done.

## 2017-05-03 LAB — CMP12+LP+TP+TSH+6AC+CBC/D/PLT
A/G RATIO: 1.4 (ref 1.2–2.2)
ALBUMIN: 4.2 g/dL (ref 3.5–5.5)
ALT: 21 IU/L (ref 0–32)
AST: 24 IU/L (ref 0–40)
Alkaline Phosphatase: 58 IU/L (ref 39–117)
BASOS ABS: 0 10*3/uL (ref 0.0–0.2)
BUN/Creatinine Ratio: 13 (ref 9–23)
BUN: 10 mg/dL (ref 6–24)
Basos: 0 %
Bilirubin Total: 0.4 mg/dL (ref 0.0–1.2)
CREATININE: 0.78 mg/dL (ref 0.57–1.00)
Calcium: 9.4 mg/dL (ref 8.7–10.2)
Chloride: 101 mmol/L (ref 96–106)
Chol/HDL Ratio: 3 ratio (ref 0.0–4.4)
Cholesterol, Total: 197 mg/dL (ref 100–199)
EOS (ABSOLUTE): 0.1 10*3/uL (ref 0.0–0.4)
Eos: 3 %
Estimated CHD Risk: <0.5 times avg. (ref 0.0–1.0)
Free Thyroxine Index: 2.1 (ref 1.2–4.9)
GFR calc Af Amer: 100 mL/min/{1.73_m2} (ref >59)
GFR, EST NON AFRICAN AMERICAN: 87 mL/min/{1.73_m2} (ref >59)
GGT: 16 IU/L (ref 0–60)
Globulin, Total: 3 g/dL (ref 1.5–4.5)
Glucose: 95 mg/dL (ref 65–99)
HDL: 66 mg/dL (ref >39)
Hematocrit: 37.6 % (ref 34.0–46.6)
Hemoglobin: 12.9 g/dL (ref 11.1–15.9)
IRON: 67 ug/dL (ref 27–159)
Immature Grans (Abs): 0 10*3/uL (ref 0.0–0.1)
Immature Granulocytes: 0 %
LDH: 205 IU/L (ref 119–226)
LDL Calculated: 114 mg/dL — ABNORMAL HIGH (ref 0–99)
LYMPHS ABS: 1.3 10*3/uL (ref 0.7–3.1)
Lymphs: 29 %
MCH: 29.1 pg (ref 26.6–33.0)
MCHC: 34.3 g/dL (ref 31.5–35.7)
MCV: 85 fL (ref 79–97)
MONOCYTES: 10 %
Monocytes Absolute: 0.5 10*3/uL (ref 0.1–0.9)
Neutrophils Absolute: 2.6 10*3/uL (ref 1.4–7.0)
Neutrophils: 58 %
PHOSPHORUS: 3.3 mg/dL (ref 2.5–4.5)
Platelets: 248 10*3/uL (ref 150–379)
Potassium: 4.4 mmol/L (ref 3.5–5.2)
RBC: 4.43 x10E6/uL (ref 3.77–5.28)
RDW: 14.3 % (ref 12.3–15.4)
Sodium: 140 mmol/L (ref 134–144)
T3 UPTAKE RATIO: 26 % (ref 24–39)
T4 TOTAL: 7.9 ug/dL (ref 4.5–12.0)
TOTAL PROTEIN: 7.2 g/dL (ref 6.0–8.5)
TRIGLYCERIDES: 84 mg/dL (ref 0–149)
TSH: 1.31 u[IU]/mL (ref 0.450–4.500)
URIC ACID: 5.3 mg/dL (ref 2.5–7.1)
VLDL Cholesterol Cal: 17 mg/dL (ref 5–40)
WBC: 4.5 10*3/uL (ref 3.4–10.8)

## 2017-05-03 LAB — VITAMIN D 25 HYDROXY (VIT D DEFICIENCY, FRACTURES): Vit D, 25-Hydroxy: 16.6 ng/mL — ABNORMAL LOW (ref 30.0–100.0)

## 2017-05-09 ENCOUNTER — Encounter: Payer: 59 | Admitting: Family Medicine

## 2017-05-16 ENCOUNTER — Ambulatory Visit (INDEPENDENT_AMBULATORY_CARE_PROVIDER_SITE_OTHER): Payer: Managed Care, Other (non HMO) | Admitting: Family Medicine

## 2017-05-16 ENCOUNTER — Encounter: Payer: Self-pay | Admitting: Family Medicine

## 2017-05-16 ENCOUNTER — Other Ambulatory Visit: Payer: Self-pay

## 2017-05-16 VITALS — BP 124/80 | HR 87 | Temp 98.0°F | Ht 63.0 in | Wt 179.5 lb

## 2017-05-16 DIAGNOSIS — E663 Overweight: Secondary | ICD-10-CM

## 2017-05-16 DIAGNOSIS — Z0001 Encounter for general adult medical examination with abnormal findings: Secondary | ICD-10-CM | POA: Diagnosis not present

## 2017-05-16 DIAGNOSIS — Z0000 Encounter for general adult medical examination without abnormal findings: Secondary | ICD-10-CM

## 2017-05-16 DIAGNOSIS — Z1231 Encounter for screening mammogram for malignant neoplasm of breast: Secondary | ICD-10-CM | POA: Diagnosis not present

## 2017-05-16 DIAGNOSIS — E559 Vitamin D deficiency, unspecified: Secondary | ICD-10-CM | POA: Insufficient documentation

## 2017-05-16 DIAGNOSIS — Z6831 Body mass index (BMI) 31.0-31.9, adult: Secondary | ICD-10-CM | POA: Diagnosis not present

## 2017-05-16 DIAGNOSIS — G4701 Insomnia due to medical condition: Secondary | ICD-10-CM | POA: Diagnosis not present

## 2017-05-16 DIAGNOSIS — Z1239 Encounter for other screening for malignant neoplasm of breast: Secondary | ICD-10-CM

## 2017-05-16 NOTE — Assessment & Plan Note (Addendum)
USPSTF grade A and B recommendations reviewed with patient; age-appropriate recommendations, preventive care, screening tests, etc discussed and encouraged; healthy living encouraged; see AVS for patient education given to patient; explained new vaccine Shingrix available for her age group, available at pharmacy

## 2017-05-16 NOTE — Progress Notes (Signed)
BP 124/80 (BP Location: Left Arm, Patient Position: Sitting, Cuff Size: Large)   Pulse 87   Temp 98 F (36.7 C) (Oral)   Ht '5\' 3"'$  (1.6 m)   Wt 179 lb 8 oz (81.4 kg)   SpO2 99%   BMI 31.80 kg/m    Subjective:    Patient ID: Stephanie Pineda, female    DOB: [birth year], 52 y.o.   MRN: [id number]  HPI: Stephanie Pineda is a 52 y.o. female  Chief Complaint  Patient presents with  . Annual Exam    HPI She is bothered by hot flashes, night sweats, and insomnia She is asking about supplements Not interested in HRT She is having trouble with waking up in the middle of the night, trouble falling back to sleep No problem with initiation of onset of sleep; exhausted when she goes to bed She has tried melatonin; she took it before bedtime with dinner, tried it for 3 nights; opposite effects Snores some, bed partner has not noticed sleep apnea No pets in the bed Quiet sleep environment Using electronics in the bedroom; TV No cell phone games in bed She drinks caffeine in the mornings; rare tea; rare chocolate She never did shift work Not exercising in the evening She has some worries, but not depressed No restless legs syndrome  She was found to have low vitamin D, on 50k per week for 6 weeks; they will retest (through occupational health) Surprising because of the time she spends outdoors; eats yogurt, loves egg yolks, drink milk Level was actually 16.6, done May 02, 2017 Glucose 95, Cr 0.78, SGPT 21, LDL was 114, HDL 66  ------------------------------------------------------------  USPSTF grade A and B recommendations Depression:  Depression screen [facility] 2/9 05/16/2017 01/10/2016 01/05/2015  Decreased Interest 0 0 0  Down, Depressed, Hopeless 0 0 0  PHQ - 2 Score 0 0 0   Hypertension: BP Readings from Last 3 Encounters:  05/16/17 124/80  05/02/17 110/70  03/16/17 110/79   Obesity: Wt Readings from Last 3 Encounters:  05/16/17 179 lb 8 oz (81.4 kg)  08/25/16 172 lb 3.2 oz (78.1  kg)  08/07/16 173 lb (78.5 kg)   BMI Readings from Last 3 Encounters:  05/16/17 31.80 kg/m  08/25/16 29.04 kg/m  08/07/16 30.65 kg/m     Skin cancer: no worrisome moles Lung cancer:  Smoked, but quit 11 years ago; never 30 pack years Breast cancer: no lumps or bumps Colorectal cancer: UTD 2016; 10 years until next; no fam hx of colon cancer  BRCA gene screening: family hx of breast and/or ovarian cancer and/or metastatic prostate cancer? no Cervical cancer screening: not seeing GYN; last pap Sept 2017; abnormal pap remotely in the 1980s HIV, hep B, hep C: not interested STD testing and prevention (chl/gon/syphilis): not interested Intimate partner violence: no abuse Contraception: ablation, was told by GYN that she couldn't get pregnancy Osteoporosis: never took steroids Fall prevention/vitamin D: discussed  Diet: eating really well, minimal fast food, minimal junk food; friend is dietician; thinks maybe too many calories; not counting calories; good water intake Exercise: 1.5-2 hours five mornings a week; doing that 4 years consistently for the most part Alcohol: maybe one bottle of wine over two months Tobacco use: quit 11 years ago Aspirin:not taking Lipids: just done Lab Results  Component Value Date   CHOL 197 05/02/2017   CHOL 224 (H) 01/10/2016   CHOL 200 (H) 01/05/2015   Lab Results  Component Value Date   HDL  66 05/02/2017   HDL 80 01/10/2016   HDL 77 01/05/2015   Lab Results  Component Value Date   LDLCALC 114 (H) 05/02/2017   LDLCALC 128 01/10/2016   LDLCALC 111 (H) 01/05/2015   Lab Results  Component Value Date   TRIG 84 05/02/2017   TRIG 80 01/10/2016   TRIG 59 01/05/2015   Lab Results  Component Value Date   CHOLHDL 3.0 05/02/2017   CHOLHDL 2.8 01/10/2016   No results found for: LDLDIRECT Glucose: just done Glucose  Date Value Ref Range Status  05/02/2017 95 65 - 99 mg/dL Final  01/05/2015 94 65 - 99 mg/dL Final   Glucose, Bld  Date  Value Ref Range Status  01/10/2016 92 65 - 99 mg/dL Final     Depression screen [facility] 2/9 05/16/2017 01/10/2016 01/05/2015  Decreased Interest 0 0 0  Down, Depressed, Hopeless 0 0 0  PHQ - 2 Score 0 0 0    Relevant past medical, surgical, family and social history reviewed Past Medical History:  Diagnosis Date  . Arthritis    right hip, left knee  . Bruised    Pelvic bone - right side  . Cervical cancer ([place])    1986   laser surg tx per pt  . Family history of adverse reaction to anesthesia    Brother "flat-lined" during appendectomy > 20 yrs ago. Has had stents placed since without issues.  Marland Kitchen Heart murmur    followed by PCP  . History of pneumonia 1983  . Hypercholesteremia   . Obesity    Past Surgical History:  Procedure Laterality Date  . COLONOSCOPY WITH PROPOFOL N/A 02/18/2015   Procedure: COLONOSCOPY WITH PROPOFOL;  Surgeon: Lucilla Lame, MD;  Location: [facility];  Service: Endoscopy;  Laterality: N/A;  . ENDOMETRIAL ABLATION  2009   laser surgery for Dysplasia  . ESOPHAGOGASTRODUODENOSCOPY    . TUBAL LIGATION     Family History  Problem Relation Age of Onset  . Asthma Mother   . Hyperlipidemia Mother   . Thyroid disease Mother   . COPD Mother   . Osteoporosis Mother   . Heart disease Mother   . Hyperlipidemia Father   . Heart disease Father   . Hypertension Father   . COPD Father   . Glaucoma Father   . Cancer Sister        melanoma  . Diabetes Brother   . Heart disease Brother   . Hypertension Brother   . Thyroid disease Daughter   . Diabetes Maternal Grandmother   . Lymphoma Brother   Patient has no hx of DNS  Social History   Tobacco Use  . Smoking status: Former Smoker    Packs/day: 1.00    Years: 20.00    Pack years: 20.00    Types: Cigarettes    Last attempt to quit: 05/01/2006    Years since quitting: 11.0  . Smokeless tobacco: Never Used  Substance Use Topics  . Alcohol use: Yes    Comment: 2 a month  . Drug use: No     Interim medical history since last visit reviewed. Allergies and medications reviewed  Review of Systems Per HPI unless specifically indicated above     Objective:    BP 124/80 (BP Location: Left Arm, Patient Position: Sitting, Cuff Size: Large)   Pulse 87   Temp 98 F (36.7 C) (Oral)   Ht '5\' 3"'$  (1.6 m)   Wt 179 lb 8 oz (81.4 kg)  SpO2 99%   BMI 31.80 kg/m   Wt Readings from Last 3 Encounters:  05/16/17 179 lb 8 oz (81.4 kg)  08/25/16 172 lb 3.2 oz (78.1 kg)  08/07/16 173 lb (78.5 kg)    Physical Exam  Constitutional: She appears well-developed and well-nourished.  HENT:  Head: Normocephalic and atraumatic.  Right Ear: Hearing, tympanic membrane, external ear and ear canal normal.  Left Ear: Hearing, tympanic membrane, external ear and ear canal normal.  Eyes: Conjunctivae and EOM are normal. Right eye exhibits no hordeolum. Left eye exhibits no hordeolum. No scleral icterus.  Neck: Carotid bruit is not present. No thyromegaly present.  Cardiovascular: Normal rate, regular rhythm, S1 normal, S2 normal and normal heart sounds.  No extrasystoles are present.  Pulmonary/Chest: Effort normal and breath sounds normal. No respiratory distress. Right breast exhibits no inverted nipple, no mass, no nipple discharge, no skin change and no tenderness. Left breast exhibits no inverted nipple, no mass, no nipple discharge, no skin change and no tenderness. Breasts are symmetrical.  Abdominal: Soft. Normal appearance and bowel sounds are normal. She exhibits no distension, no abdominal bruit, no pulsatile midline mass and no mass. There is no hepatosplenomegaly. There is no tenderness. No hernia.  Musculoskeletal: Normal range of motion. She exhibits no edema.  Lymphadenopathy:       Head (right side): No submandibular adenopathy present.       Head (left side): No submandibular adenopathy present.    She has no cervical adenopathy.    She has no axillary adenopathy.  Neurological:  She is alert. She displays no tremor. No cranial nerve deficit. She exhibits normal muscle tone. Gait normal.  Reflex Scores:      Patellar reflexes are 2+ on the right side and 2+ on the left side. Skin: Skin is warm and dry. No bruising and no ecchymosis noted. No cyanosis. No pallor.  Psychiatric: Her speech is normal and behavior is normal. Thought content normal. Her mood appears not anxious. She does not exhibit a depressed mood.    Results for orders placed or performed in visit on 05/02/17  Vitamin D (25 hydroxy)  Result Value Ref Range   Vit D, 25-Hydroxy 16.6 (L) 30.0 - 100.0 ng/mL  Executive Panel  Result Value Ref Range   Glucose 95 65 - 99 mg/dL   Uric Acid 5.3 2.5 - 7.1 mg/dL   BUN 10 6 - 24 mg/dL   Creatinine, Ser 0.78 0.57 - 1.00 mg/dL   GFR calc non Af Amer 87 >59 mL/min/1.73   GFR calc Af Amer 100 >59 mL/min/1.73   BUN/Creatinine Ratio 13 9 - 23   Sodium 140 134 - 144 mmol/L   Potassium 4.4 3.5 - 5.2 mmol/L   Chloride 101 96 - 106 mmol/L   Calcium 9.4 8.7 - 10.2 mg/dL   Phosphorus 3.3 2.5 - 4.5 mg/dL   Total Protein 7.2 6.0 - 8.5 g/dL   Albumin 4.2 3.5 - 5.5 g/dL   Globulin, Total 3.0 1.5 - 4.5 g/dL   Albumin/Globulin Ratio 1.4 1.2 - 2.2   Bilirubin Total 0.4 0.0 - 1.2 mg/dL   Alkaline Phosphatase 58 39 - 117 IU/L   LDH 205 119 - 226 IU/L   AST 24 0 - 40 IU/L   ALT 21 0 - 32 IU/L   GGT 16 0 - 60 IU/L   Iron 67 27 - 159 ug/dL   Cholesterol, Total 197 100 - 199 mg/dL   Triglycerides 84 0 - 149  mg/dL   HDL 66 >39 mg/dL   VLDL Cholesterol Cal 17 5 - 40 mg/dL   LDL Calculated 114 (H) 0 - 99 mg/dL   Chol/HDL Ratio 3.0 0.0 - 4.4 ratio   Estimated CHD Risk  < 0.5 0.0 - 1.0 times avg.   TSH 1.310 0.450 - 4.500 uIU/mL   T4, Total 7.9 4.5 - 12.0 ug/dL   T3 Uptake Ratio 26 24 - 39 %   Free Thyroxine Index 2.1 1.2 - 4.9   WBC 4.5 3.4 - 10.8 x10E3/uL   RBC 4.43 3.77 - 5.28 x10E6/uL   Hemoglobin 12.9 11.1 - 15.9 g/dL   Hematocrit 37.6 34.0 - 46.6 %   MCV 85 79 -  97 fL   MCH 29.1 26.6 - 33.0 pg   MCHC 34.3 31.5 - 35.7 g/dL   RDW 14.3 12.3 - 15.4 %   Platelets 248 150 - 379 x10E3/uL   Neutrophils 58 Not Estab. %   Lymphs 29 Not Estab. %   Monocytes 10 Not Estab. %   Eos 3 Not Estab. %   Basos 0 Not Estab. %   Neutrophils Absolute 2.6 1.4 - 7.0 x10E3/uL   Lymphocytes Absolute 1.3 0.7 - 3.1 x10E3/uL   Monocytes Absolute 0.5 0.1 - 0.9 x10E3/uL   EOS (ABSOLUTE) 0.1 0.0 - 0.4 x10E3/uL   Basophils Absolute 0.0 0.0 - 0.2 x10E3/uL   Immature Granulocytes 0 Not Estab. %   Immature Grans (Abs) 0.0 0.0 - 0.1 x10E3/uL      Assessment & Plan:   Problem List Items Addressed This Visit      Other   Vitamin D deficiency    On 50k once a week for 6 weeks and then retest, through occupational health      Preventative health care - Primary    USPSTF grade A and B recommendations reviewed with patient; age-appropriate recommendations, preventive care, screening tests, etc discussed and encouraged; healthy living encouraged; see AVS for patient education given to patient; explained new vaccine Shingrix available for her age group, available at pharmacy      Overweight (BMI 25.0-29.9)    Patient is frustrated by his; I suggested that she try calorie counting for a few days with an app to get an idea of how many calories she is actually consuming; decreasing total calories and staying active and drinking adequate water should result in weight loss      Breast cancer screening    Encouraged monthly SBE; explained what Paget's may look like; CBE done today; yearly mammograms encouraged       Other Visit Diagnoses    Insomnia due to medical condition       thorough hx done; discussed ddx; try natural remedies; trazodone if needed; AVOID benzos, sedative hypnotics; call if needed; list of CBT-I counselors given       Follow up plan: Return for complete physical.  An after-visit summary was printed and given to the patient at [facility].  Please see the  patient instructions which may contain other information and recommendations beyond what is mentioned above in the assessment and plan.  No orders of the defined types were placed in this encounter.   No orders of the defined types were placed in this encounter.

## 2017-05-16 NOTE — Assessment & Plan Note (Signed)
Patient is frustrated by his; I suggested that she try calorie counting for a few days with an app to get an idea of how many calories she is actually consuming; decreasing total calories and staying active and drinking adequate water should result in weight loss

## 2017-05-16 NOTE — Assessment & Plan Note (Signed)
On 50k once a week for 6 weeks and then retest, through occupational health

## 2017-05-16 NOTE — Patient Instructions (Addendum)
Check out the information at familydoctor.org entitled "Nutrition for Weight Loss: What You Need to Know about Fad Diets" Try to lose between 1-2 pounds per week by taking in fewer calories and burning off more calories You can succeed by limiting portions, limiting foods dense in calories and fat, becoming more active, and drinking 8 glasses of water a day (64 ounces) Don't skip meals, especially breakfast, as skipping meals may alter your metabolism Do not use over-the-counter weight loss pills or gimmicks that claim rapid weight loss A healthy BMI (or body mass index) is between 18.5 and 24.9 You can calculate your ideal BMI at the [place] website [employer]fr  Consider cognitive behavioral therapy for insomnia Consider a sleep study to find out why you are waking up, in case there is a medical reason Just bought some lavender You can try the Bedtime tea by [place]opes can read about trazodone to help with insomnia, low dose antidepressant for sleep (25-50 mg)  Insomnia Insomnia is a sleep disorder that makes it difficult to fall asleep or to stay asleep. Insomnia can cause tiredness (fatigue), low energy, difficulty concentrating, mood swings, and poor performance at work or school. There are three different ways to classify insomnia:  Difficulty falling asleep.  Difficulty staying asleep.  Waking up too early in the morning.  Any type of insomnia can be long-term (chronic) or short-term (acute). Both are common. Short-term insomnia usually lasts for three months or less. Chronic insomnia occurs at least three times a week for longer than three months. What are the causes? Insomnia may be caused by another condition, situation, or substance, such as:  Anxiety.  Certain medicines.  Gastroesophageal reflux disease (GERD) or other gastrointestinal conditions.  Asthma or other breathing conditions.  Restless legs syndrome, sleep  apnea, or other sleep disorders.  Chronic pain.  Menopause. This may include hot flashes.  Stroke.  Abuse of alcohol, tobacco, or illegal drugs.  Depression.  Caffeine.  Neurological disorders, such as Alzheimer disease.  An overactive thyroid (hyperthyroidism).  The cause of insomnia may not be known. What increases the risk? Risk factors for insomnia include:  Gender. Women are more commonly affected than men.  Age. Insomnia is more common as you get older.  Stress. This may involve your professional or personal life.  Income. Insomnia is more common in people with lower income.  Lack of exercise.  Irregular work schedule or night shifts.  Traveling between different time zones.  What are the signs or symptoms? If you have insomnia, trouble falling asleep or trouble staying asleep is the main symptom. This may lead to other symptoms, such as:  Feeling fatigued.  Feeling nervous about going to sleep.  Not feeling rested in the morning.  Having trouble concentrating.  Feeling irritable, anxious, or depressed.  How is this treated? Treatment for insomnia depends on the cause. If your insomnia is caused by an underlying condition, treatment will focus on addressing the condition. Treatment may also include:  Medicines to help you sleep.  Counseling or therapy.  Lifestyle adjustments.  Follow these instructions at home:  Take medicines only as directed by your health care provider.  Keep regular sleeping and waking hours. Avoid naps.  Keep a sleep diary to help you and your health care provider figure out what could be causing your insomnia. Include: ? When you sleep. ? When you wake up during the night. ? How well you sleep. ? How rested you feel the next day. ? Any side  effects of medicines you are taking. ? What you eat and drink.  Make your bedroom a comfortable place where it is easy to fall asleep: ? Put up shades or special blackout  curtains to block light from outside. ? Use a white noise machine to block noise. ? Keep the temperature cool.  Exercise regularly as directed by your health care provider. Avoid exercising right before bedtime.  Use relaxation techniques to manage stress. Ask your health care provider to suggest some techniques that may work well for you. These may include: ? Breathing exercises. ? Routines to release muscle tension. ? Visualizing peaceful scenes.  Cut back on alcohol, caffeinated beverages, and cigarettes, especially close to bedtime. These can disrupt your sleep.  Do not overeat or eat spicy foods right before bedtime. This can lead to digestive discomfort that can make it hard for you to sleep.  Limit screen use before bedtime. This includes: ? Watching TV. ? Using your smartphone, tablet, and computer.  Stick to a routine. This can help you fall asleep faster. Try to do a quiet activity, brush your teeth, and go to bed at the same time each night.  Get out of bed if you are still awake after 15 minutes of trying to sleep. Keep the lights down, but try reading or doing a quiet activity. When you feel sleepy, go back to bed.  Make sure that you drive carefully. Avoid driving if you feel very sleepy.  Keep all follow-up appointments as directed by your health care provider. This is important. Contact a health care provider if:  You are tired throughout the day or have trouble in your daily routine due to sleepiness.  You continue to have sleep problems or your sleep problems get worse. Get help right away if:  You have serious thoughts about hurting yourself or someone else. This information is not intended to replace advice given to you by your health care provider. Make sure you discuss any questions you have with your health care provider. Document Released: 04/14/2000 Document Revised: 09/17/2015 Document Reviewed: 01/16/2014 Elsevier Interactive Patient Education  2018  [place] Maintenance, Female Adopting a healthy lifestyle and getting preventive care can go a long way to promote health and wellness. Talk with your health care provider about what schedule of regular examinations is right for you. This is a good chance for you to check in with your provider about disease prevention and staying healthy. In between checkups, there are plenty of things you can do on your own. Experts have done a lot of research about which lifestyle changes and preventive measures are most likely to keep you healthy. Ask your health care provider for more information. Weight and diet Eat a healthy diet  Be sure to include plenty of vegetables, fruits, low-fat dairy products, and lean protein.  Do not eat a lot of foods high in solid fats, added sugars, or salt.  Get regular exercise. This is one of the most important things you can do for your health. ? Most adults should exercise for at least 150 minutes each week. The exercise should increase your heart rate and make you sweat (moderate-intensity exercise). ? Most adults should also do strengthening exercises at least twice a week. This is in addition to the moderate-intensity exercise.  Maintain a healthy weight  Body mass index (BMI) is a measurement that can be used to identify possible weight problems. It estimates body fat based on height and weight. Your health care  provider can help determine your BMI and help you achieve or maintain a healthy weight.  For females 10 years of age and older: ? A BMI below 18.5 is considered underweight. ? A BMI of 18.5 to 24.9 is normal. ? A BMI of 25 to 29.9 is considered overweight. ? A BMI of 30 and above is considered obese.  Watch levels of cholesterol and blood lipids  You should start having your blood tested for lipids and cholesterol at 54 years of age, then have this test every 5 years.  You may need to have your cholesterol levels checked more often  if: ? Your lipid or cholesterol levels are high. ? You are older than 54 years of age. ? You are at high risk for heart disease.  Cancer screening Lung Cancer  Lung cancer screening is recommended for adults 62-38 years old who are at high risk for lung cancer because of a history of smoking.  A yearly low-dose CT scan of the lungs is recommended for people who: ? Currently smoke. ? Have quit within the past 15 years. ? Have at least a 30-pack-year history of smoking. A pack year is smoking an average of one pack of cigarettes a day for 1 year.  Yearly screening should continue until it has been 15 years since you quit.  Yearly screening should stop if you develop a health problem that would prevent you from having lung cancer treatment.  Breast Cancer  Practice breast self-awareness. This means understanding how your breasts normally appear and feel.  It also means doing regular breast self-exams. Let your health care provider know about any changes, no matter how small.  If you are in your 20s or 30s, you should have a clinical breast exam (CBE) by a health care provider every 1-3 years as part of a regular health exam.  If you are 37 or older, have a CBE every year. Also consider having a breast X-ray (mammogram) every year.  If you have a family history of breast cancer, talk to your health care provider about genetic screening.  If you are at high risk for breast cancer, talk to your health care provider about having an MRI and a mammogram every year.  Breast cancer gene (BRCA) assessment is recommended for women who have family members with BRCA-related cancers. BRCA-related cancers include: ? Breast. ? Ovarian. ? Tubal. ? Peritoneal cancers.  Results of the assessment will determine the need for genetic counseling and BRCA1 and BRCA2 testing.  Cervical Cancer Your health care provider may recommend that you be screened regularly for cancer of the pelvic organs  (ovaries, uterus, and vagina). This screening involves a pelvic examination, including checking for microscopic changes to the surface of your cervix (Pap test). You may be encouraged to have this screening done every 3 years, beginning at age 78.  For women ages 71-65, health care providers may recommend pelvic exams and Pap testing every 3 years, or they may recommend the Pap and pelvic exam, combined with testing for human papilloma virus (HPV), every 5 years. Some types of HPV increase your risk of cervical cancer. Testing for HPV may also be done on women of any age with unclear Pap test results.  Other health care providers may not recommend any screening for nonpregnant women who are considered low risk for pelvic cancer and who do not have symptoms. Ask your health care provider if a screening pelvic exam is right for you.  If you have had  past treatment for cervical cancer or a condition that could lead to cancer, you need Pap tests and screening for cancer for at least 20 years after your treatment. If Pap tests have been discontinued, your risk factors (such as having a new sexual partner) need to be reassessed to determine if screening should resume. Some women have medical problems that increase the chance of getting cervical cancer. In these cases, your health care provider may recommend more frequent screening and Pap tests.  Colorectal Cancer  This type of cancer can be detected and often prevented.  Routine colorectal cancer screening usually begins at 54 years of age and continues through 54 years of age.  Your health care provider may recommend screening at an earlier age if you have risk factors for colon cancer.  Your health care provider may also recommend using home test kits to check for hidden blood in the stool.  A small camera at the end of a tube can be used to examine your colon directly (sigmoidoscopy or colonoscopy). This is done to check for the earliest forms of  colorectal cancer.  Routine screening usually begins at age 79.  Direct examination of the colon should be repeated every 5-10 years through 55 years of age. However, you may need to be screened more often if early forms of precancerous polyps or small growths are found.  Skin Cancer  Check your skin from head to toe regularly.  Tell your health care provider about any new moles or changes in moles, especially if there is a change in a mole's shape or color.  Also tell your health care provider if you have a mole that is larger than the size of a pencil eraser.  Always use sunscreen. Apply sunscreen liberally and repeatedly throughout the day.  Protect yourself by wearing long sleeves, pants, a wide-brimmed hat, and sunglasses whenever you are outside.  Heart disease, diabetes, and high blood pressure  High blood pressure causes heart disease and increases the risk of stroke. High blood pressure is more likely to develop in: ? People who have blood pressure in the high end of the normal range (130-139/85-89 mm Hg). ? People who are overweight or obese. ? People who are African American.  If you are 69-1 years of age, have your blood pressure checked every 3-5 years. If you are 2 years of age or older, have your blood pressure checked every year. You should have your blood pressure measured twice-once when you are at a hospital or clinic, and once when you are not at a hospital or clinic. Record the average of the two measurements. To check your blood pressure when you are not at a hospital or clinic, you can use: ? An automated blood pressure machine at a pharmacy. ? A home blood pressure monitor.  If you are between 71 years and 2 years old, ask your health care provider if you should take aspirin to prevent strokes.  Have regular diabetes screenings. This involves taking a blood sample to check your fasting blood sugar level. ? If you are at a normal weight and have a low risk  for diabetes, have this test once every three years after 54 years of age. ? If you are overweight and have a high risk for diabetes, consider being tested at a younger age or more often. Preventing infection Hepatitis B  If you have a higher risk for hepatitis B, you should be screened for this virus. You are considered at high  risk for hepatitis B if: ? You were born in a country where hepatitis B is common. Ask your health care provider which countries are considered high risk. ? Your parents were born in a high-risk country, and you have not been immunized against hepatitis B (hepatitis B vaccine). ? You have HIV or AIDS. ? You use needles to inject street drugs. ? You live with someone who has hepatitis B. ? You have had sex with someone who has hepatitis B. ? You get hemodialysis treatment. ? You take certain medicines for conditions, including cancer, organ transplantation, and autoimmune conditions.  Hepatitis C  Blood testing is recommended for: ? Everyone born from [birth year] through 1967. ? Anyone with known risk factors for hepatitis C.  Sexually transmitted infections (STIs)  You should be screened for sexually transmitted infections (STIs) including gonorrhea and chlamydia if: ? You are sexually active and are younger than 54 years of age. ? You are older than 54 years of age and your health care provider tells you that you are at risk for this type of infection. ? Your sexual activity has changed since you were last screened and you are at an increased risk for chlamydia or gonorrhea. Ask your health care provider if you are at risk.  If you do not have HIV, but are at risk, it may be recommended that you take a prescription medicine daily to prevent HIV infection. This is called pre-exposure prophylaxis (PrEP). You are considered at risk if: ? You are sexually active and do not regularly use condoms or know the HIV status of your partner(s). ? You take drugs by  injection. ? You are sexually active with a partner who has HIV.  Talk with your health care provider about whether you are at high risk of being infected with HIV. If you choose to begin PrEP, you should first be tested for HIV. You should then be tested every 3 months for as long as you are taking PrEP. Pregnancy  If you are premenopausal and you may become pregnant, ask your health care provider about preconception counseling.  If you may become pregnant, take 400 to 800 micrograms (mcg) of folic acid every day.  If you want to prevent pregnancy, talk to your health care provider about birth control (contraception). Osteoporosis and menopause  Osteoporosis is a disease in which the bones lose minerals and strength with aging. This can result in serious bone fractures. Your risk for osteoporosis can be identified using a bone density scan.  If you are 74 years of age or older, or if you are at risk for osteoporosis and fractures, ask your health care provider if you should be screened.  Ask your health care provider whether you should take a calcium or vitamin D supplement to lower your risk for osteoporosis.  Menopause may have certain physical symptoms and risks.  Hormone replacement therapy may reduce some of these symptoms and risks. Talk to your health care provider about whether hormone replacement therapy is right for you. Follow these instructions at home:  Schedule regular health, dental, and eye exams.  Stay current with your immunizations.  Do not use any tobacco products including cigarettes, chewing tobacco, or electronic cigarettes.  If you are pregnant, do not drink alcohol.  If you are breastfeeding, limit how much and how often you drink alcohol.  Limit alcohol intake to no more than 1 drink per day for nonpregnant women. One drink equals 12 ounces of beer, 5 ounces of  wine, or 1 ounces of hard liquor.  Do not use street drugs.  Do not share needles.  Ask  your health care provider for help if you need support or information about quitting drugs.  Tell your health care provider if you often feel depressed.  Tell your health care provider if you have ever been abused or do not feel safe at home. This information is not intended to replace advice given to you by your health care provider. Make sure you discuss any questions you have with your health care provider. Document Released: 10/31/2010 Document Revised: 09/23/2015 Document Reviewed: 01/19/2015 Elsevier Interactive Patient Education  [employer].

## 2017-05-16 NOTE — Assessment & Plan Note (Signed)
Encouraged monthly SBE; explained what [facility]'s may look like; CBE done today; yearly mammograms encouraged

## 2017-06-07 ENCOUNTER — Ambulatory Visit: Payer: Self-pay | Admitting: Medical

## 2017-06-07 VITALS — BP 110/70 | HR 83 | Temp 99.3°F | Resp 17

## 2017-06-07 DIAGNOSIS — R509 Fever, unspecified: Secondary | ICD-10-CM

## 2017-06-07 DIAGNOSIS — R05 Cough: Secondary | ICD-10-CM

## 2017-06-07 DIAGNOSIS — J069 Acute upper respiratory infection, unspecified: Secondary | ICD-10-CM

## 2017-06-07 DIAGNOSIS — R059 Cough, unspecified: Secondary | ICD-10-CM

## 2017-06-07 LAB — POCT INFLUENZA A/B
Influenza A, POC: NEGATIVE
Influenza B, POC: NEGATIVE

## 2017-06-07 MED ORDER — BENZONATATE 100 MG PO CAPS: 100.0000 mg | ORAL_CAPSULE | Freq: Two times a day (BID) | ORAL | 0 refills | Status: DC | PRN

## 2017-06-07 MED ORDER — AZITHROMYCIN 250 MG PO TABS: ORAL_TABLET | ORAL | 0 refills | Status: DC

## 2017-06-07 NOTE — Progress Notes (Signed)
   Subjective:    Patient ID: Stephanie Pineda, female    DOB: [birth year], 52 y.o.   MRN: [id number]  HPI  52 yo female in non acuate distress with yesterday starting at 12:30 am chills , fever, cough that hurts, nonproductive. Mild body aches. Nasal congestion and sore throat. Husband with a virus . Patient non smoker. History of Pneumonia in 1983 hospitalized for 10 days.  Review of Systems  Constitutional: Positive for chills and fever.  HENT: Positive for congestion and sore throat. Negative for ear pain, sinus pressure and sneezing.   Respiratory: Positive for cough. Negative for shortness of breath and wheezing.   Cardiovascular: Negative for chest pain.  Gastrointestinal: Negative for abdominal pain.  Musculoskeletal: Positive for myalgias (mild per patient).  Skin: Negative for rash.  Neurological: Positive for dizziness. Negative for syncope and light-headedness.       Objective:   Physical Exam  Constitutional: She is oriented to person, place, and time. She appears well-developed and well-nourished.  HENT:  Head: Normocephalic and atraumatic.  Eyes: Conjunctivae and EOM are normal.  Neck: Normal range of motion. Neck supple.  Cardiovascular: Normal rate, regular rhythm and normal heart sounds.  Pulmonary/Chest: Effort normal. She has wheezes (right side inspiratory mild).  Neurological: She is alert and oriented to person, place, and time.  Skin: Skin is warm and dry.  Psychiatric: She has a normal mood and affect. Her behavior is normal. Judgment and thought content normal.  Nursing note and vitals reviewed.   Cough noted in room  Flu test negative    Assessment & Plan:  Upper Respiratory Infection/ possibly Bronchitis, cough Meds ordered this encounter  Medications  . azithromycin (ZITHROMAX) 250 MG tablet    Sig: Take 2 tablets by mouth day 1 then one tablet days 2-5 take with food    Dispense:  6 tablet    Refill:  0  . benzonatate (TESSALON) 100 MG capsule     Sig: Take 1 capsule (100 mg total) by mouth 2 (two) times daily as needed for cough.    Dispense:  30 capsule    Refill:  0  Return in 3-5 days if not improving. Rest and increase fluids. Patient verbalizes understanding and has no questions at discharge.

## 2017-06-21 ENCOUNTER — Other Ambulatory Visit: Payer: Self-pay

## 2017-06-21 DIAGNOSIS — E559 Vitamin D deficiency, unspecified: Secondary | ICD-10-CM

## 2017-06-22 LAB — VITAMIN D 25 HYDROXY (VIT D DEFICIENCY, FRACTURES): Vit D, 25-Hydroxy: 29.1 ng/mL — ABNORMAL LOW (ref 30.0–100.0)

## 2018-05-20 ENCOUNTER — Ambulatory Visit (INDEPENDENT_AMBULATORY_CARE_PROVIDER_SITE_OTHER): Payer: Managed Care, Other (non HMO) | Admitting: Family Medicine

## 2018-05-20 ENCOUNTER — Other Ambulatory Visit (HOSPITAL_COMMUNITY)
Admission: RE | Admit: 2018-05-20 | Discharge: 2018-05-20 | Disposition: A | Discharge status: Home / Self Care | Payer: Managed Care, Other (non HMO) | Source: Ambulatory Visit | Attending: Family Medicine | Admitting: Family Medicine

## 2018-05-20 ENCOUNTER — Encounter: Payer: Self-pay | Admitting: Family Medicine

## 2018-05-20 VITALS — BP 112/68 | HR 79 | Temp 97.6°F | Ht 63.0 in | Wt 182.4 lb

## 2018-05-20 DIAGNOSIS — E66811 Obesity, class 1: Secondary | ICD-10-CM | POA: Insufficient documentation

## 2018-05-20 DIAGNOSIS — Z124 Encounter for screening for malignant neoplasm of cervix: Secondary | ICD-10-CM

## 2018-05-20 DIAGNOSIS — Z0000 Encounter for general adult medical examination without abnormal findings: Secondary | ICD-10-CM | POA: Diagnosis not present

## 2018-05-20 DIAGNOSIS — E669 Obesity, unspecified: Secondary | ICD-10-CM | POA: Insufficient documentation

## 2018-05-20 DIAGNOSIS — D333 Benign neoplasm of cranial nerves: Secondary | ICD-10-CM

## 2018-05-20 DIAGNOSIS — Z1239 Encounter for other screening for malignant neoplasm of breast: Secondary | ICD-10-CM | POA: Diagnosis not present

## 2018-05-20 NOTE — Progress Notes (Signed)
Greetings. It was a pleasure to see you today. Your complete blood count is normal. You have normal numbers of white blood cells, red blood cells, and platelets. The other labs are pending. Peace, Dr. Hani Campusano

## 2018-05-20 NOTE — Patient Instructions (Addendum)
Check out the information at familydoctor.org entitled "Nutrition for Weight Loss: What You Need to Know about Fad Diets" Try to lose between 1-2 pounds per week by taking in fewer calories and burning off more calories You can succeed by limiting portions, limiting foods dense in calories and fat, becoming more active, and drinking 8 glasses of water a day (64 ounces) Don't skip meals, especially breakfast, as skipping meals may alter your metabolism Do not use over-the-counter weight loss pills or gimmicks that claim rapid weight loss A healthy BMI (or body mass index) is between 18.5 and 24.9 You can calculate your ideal BMI at the [place] website ClubMonetize.fr  Please do call to schedule your mammogram; the number to schedule one at either [facility] or [facility] is [phone]   Obesity, Adult Obesity is the condition of having too much total body fat. Being overweight or obese means that your weight is greater than what is considered healthy for your body size. Obesity is determined by a measurement called BMI. BMI is an estimate of body fat and is calculated from height and weight. For adults, a BMI of 30 or higher is considered obese. Obesity can eventually lead to other health concerns and major illnesses, including:  Stroke.  Coronary artery disease (CAD).  Type 2 diabetes.  Some types of cancer, including cancers of the colon, breast, uterus, and gallbladder.  Osteoarthritis.  High blood pressure (hypertension).  High cholesterol.  Sleep apnea.  Gallbladder stones.  Infertility problems. What are the causes? The main cause of obesity is taking in (consuming) more calories than your body uses for energy. Other factors that contribute to this condition may include:  Being born with genes that make you more likely to become obese.  Having a medical condition that causes obesity. These  conditions include: ? Hypothyroidism. ? Polycystic ovarian syndrome (PCOS). ? Binge-eating disorder. ? Cushing syndrome.  Taking certain medicines, such as steroids, antidepressants, and seizure medicines.  Not being physically active (sedentary lifestyle).  Living where there are limited places to exercise safely or buy healthy foods.  Not getting enough sleep. What increases the risk? The following factors may increase your risk of this condition:  Having a family history of obesity.  Being a woman of African-American descent.  Being a man of Hispanic descent. What are the signs or symptoms? Having excessive body fat is the main symptom of this condition. How is this diagnosed? This condition may be diagnosed based on:  Your symptoms.  Your medical history.  A physical exam. Your health care provider may measure: ? Your BMI. If you are an adult with a BMI between 25 and less than 30, you are considered overweight. If you are an adult with a BMI of 30 or higher, you are considered obese. ? The distances around your hips and your waist (circumferences). These may be compared to each other to help diagnose your condition. ? Your skinfold thickness. Your health care provider may gently pinch a fold of your skin and measure it. How is this treated? Treatment for this condition often includes changing your lifestyle. Treatment may include some or all of the following:  Dietary changes. Work with your health care provider and a dietitian to set a weight-loss goal that is healthy and reasonable for you. Dietary changes may include eating: ? Smaller portions. A portion size is the amount of a particular food that is healthy for you to eat at one time. This varies  from person to person. ? Low-calorie or low-fat options. ? More whole grains, fruits, and vegetables.  Regular physical activity. This may include aerobic activity (cardio) and strength training.  Medicine to help you  lose weight. Your health care provider may prescribe medicine if you are unable to lose 1 pound a week after 6 weeks of eating more healthily and doing more physical activity.  Surgery. Surgical options may include gastric banding and gastric bypass. Surgery may be done if: ? Other treatments have not helped to improve your condition. ? You have a BMI of 40 or higher. ? You have life-threatening health problems related to obesity. Follow these instructions at home:  Eating and drinking   Follow recommendations from your health care provider about what you eat and drink. Your health care provider may advise you to: ? Limit fast foods, sweets, and processed snack foods. ? Choose low-fat options, such as low-fat milk instead of whole milk. ? Eat 5 or more servings of fruits or vegetables every day. ? Eat at home more often. This gives you more control over what you eat. ? Choose healthy foods when you eat out. ? Learn what a healthy portion size is. ? Keep low-fat snacks on hand. ? Avoid sugary drinks, such as soda, fruit juice, iced tea sweetened with sugar, and flavored milk. ? Eat a healthy breakfast.  Drink enough water to keep your urine clear or pale yellow.  Do not go without eating for long periods of time (do not fast) or follow a fad diet. Fasting and fad diets can be unhealthy and even dangerous. Physical Activity  Exercise regularly, as told by your health care provider. Ask your health care provider what types of exercise are safe for you and how often you should exercise.  Warm up and stretch before being active.  Cool down and stretch after being active.  Rest between periods of activity. Lifestyle  Limit the time that you spend in front of your TV, computer, or video game system.  Find ways to reward yourself that do not involve food.  Limit alcohol intake to no more than 1 drink a day for nonpregnant women and 2 drinks a day for men. One drink equals 12 oz of  beer, 5 oz of wine, or 1 oz of hard liquor. General instructions  Keep a weight loss journal to keep track of the food you eat and how much you exercise you get.  Take over-the-counter and prescription medicines only as told by your health care provider.  Take vitamins and supplements only as told by your health care provider.  Consider joining a support group. Your health care provider may be able to recommend a support group.  Keep all follow-up visits as told by your health care provider. This is important. Contact a health care provider if:  You are unable to meet your weight loss goal after 6 weeks of dietary and lifestyle changes. This information is not intended to replace advice given to you by your health care provider. Make sure you discuss any questions you have with your health care provider. Document Released: 05/25/2004 Document Revised: 09/20/2015 Document Reviewed: 02/03/2015 Elsevier Interactive Patient Education  2019 [employer]  Preventing Unhealthy [employer], Adult Staying at a healthy weight is important to your overall health. When fat builds up in your body, you may become overweight or obese. Being overweight or obese increases your risk of developing certain health problems, such as heart disease, diabetes, sleeping problems, joint  problems, and some types of cancer. Unhealthy weight gain is often the result of making unhealthy food choices or not getting enough exercise. You can make changes to your lifestyle to prevent obesity and stay as healthy as possible. What nutrition changes can be made?   Eat only as much as your body needs. To do this: ? Pay attention to signs that you are hungry or full. Stop eating as soon as you feel full. ? If you feel hungry, try drinking water first before eating. Drink enough water so your urine is clear or pale yellow. ? Eat smaller portions. Pay attention to portion sizes when eating out. ? Look at serving sizes on food  labels. Most foods contain more than one serving per container. ? Eat the recommended number of calories for your gender and activity level. For most active people, a daily total of 2,000 calories is appropriate. If you are trying to lose weight or are not very active, you may need to eat fewer calories. Talk with your health care provider or a diet and nutrition specialist (dietitian) about how many calories you need each day.  Choose healthy foods, such as: ? Fruits and vegetables. At each meal, try to fill at least half of your plate with fruits and vegetables. ? Whole grains, such as whole-wheat bread, brown [place], and quinoa. ? Lean meats, such as chicken or fish. ? Other healthy proteins, such as beans, eggs, or tofu. ? Healthy fats, such as nuts, seeds, fatty fish, and olive oil. ? Low-fat or fat-free dairy products.  Check food labels, and avoid food and drinks that: ? Are high in calories. ? Have added sugar. ? Are high in sodium. ? Have saturated fats or trans fats.  Cook foods in healthier ways, such as by baking, broiling, or grilling.  Make a meal plan for the week, and shop with a grocery list to help you stay on track with your purchases. Try to avoid going to the grocery store when you are hungry.  When grocery shopping, try to shop around the outside of the store first, where the fresh foods are. Doing this helps you to avoid prepackaged foods, which can be high in sugar, salt (sodium), and fat. What lifestyle changes can be made?   Exercise for 30 or more minutes on 5 or more days each week. Exercising may include brisk walking, yard work, biking, running, swimming, and team sports like basketball and soccer. Ask your health care provider which exercises are safe for you.  Do muscle-strengthening activities, such as lifting weights or using resistance bands, on 2 or more days a week.  Do not use any products that contain nicotine or tobacco, such as cigarettes and  e-cigarettes. If you need help quitting, ask your health care provider.  Limit alcohol intake to no more than 1 drink a day for nonpregnant women and 2 drinks a day for men. One drink equals 12 oz of beer, 5 oz of wine, or 1 oz of hard liquor.  Try to get 7-9 hours of sleep each night. What other changes can be made?  Keep a food and activity journal to keep track of: ? What you ate and how many calories you had. Remember to count the calories in sauces, dressings, and side dishes. ? Whether you were active, and what exercises you did. ? Your calorie, weight, and activity goals.  Check your weight regularly. Track any changes. If you notice you have gained weight, make changes to  your diet or activity routine.  Avoid taking weight-loss medicines or supplements. Talk to your health care provider before starting any new medicine or supplement.  Talk to your health care provider before trying any new diet or exercise plan. Why are these changes important? Eating healthy, staying active, and having healthy habits can help you to prevent obesity. Those changes also:  Help you manage stress and emotions.  Help you connect with friends and family.  Improve your self-esteem.  Improve your sleep.  Prevent long-term health problems. What can happen if changes are not made? Being obese or overweight can cause you to develop joint or bone problems, which can make it hard for you to stay active or do activities you enjoy. Being obese or overweight also puts stress on your heart and lungs and can lead to health problems like diabetes, heart disease, and some cancers. Where to find more information Talk with your health care provider or a dietitian about healthy eating and healthy lifestyle choices. You may also find information from:  U.[place]. Department of Agriculture, MyPlate: FormerBoss.no  American Heart Association: www.heart.org  Centers for Disease Control and Prevention:  http://www.wolf.info/ Summary  Staying at a healthy weight is important to your overall health. It helps you to prevent certain diseases and health problems, such as heart disease, diabetes, joint problems, sleep disorders, and some types of cancer.  Being obese or overweight can cause you to develop joint or bone problems, which can make it hard for you to stay active or do activities you enjoy.  You can prevent unhealthy weight gain by eating a healthy diet, exercising regularly, not smoking, limiting alcohol, and getting enough sleep.  Talk with your health care provider or a dietitian for guidance about healthy eating and healthy lifestyle choices. This information is not intended to replace advice given to you by your health care provider. Make sure you discuss any questions you have with your health care provider. Document Released: 04/18/2016 Document Revised: 01/26/2017 Document Reviewed: 05/24/2016 Elsevier Interactive Patient Education  2019 [place] Maintenance, Female Adopting a healthy lifestyle and getting preventive care can go a long way to promote health and wellness. Talk with your health care provider about what schedule of regular examinations is right for you. This is a good chance for you to check in with your provider about disease prevention and staying healthy. In between checkups, there are plenty of things you can do on your own. Experts have done a lot of research about which lifestyle changes and preventive measures are most likely to keep you healthy. Ask your health care provider for more information. Weight and diet Eat a healthy diet  Be sure to include plenty of vegetables, fruits, low-fat dairy products, and lean protein.  Do not eat a lot of foods high in solid fats, added sugars, or salt.  Get regular exercise. This is one of the most important things you can do for your health. ? Most adults should exercise for at least 150 minutes each week. The  exercise should increase your heart rate and make you sweat (moderate-intensity exercise). ? Most adults should also do strengthening exercises at least twice a week. This is in addition to the moderate-intensity exercise. Maintain a healthy weight  Body mass index (BMI) is a measurement that can be used to identify possible weight problems. It estimates body fat based on height and weight. Your health care provider can help determine your BMI and help you achieve or maintain a  healthy weight.  For females 63 years of age and older: ? A BMI below 18.5 is considered underweight. ? A BMI of 18.5 to 24.9 is normal. ? A BMI of 25 to 29.9 is considered overweight. ? A BMI of 30 and above is considered obese. Watch levels of cholesterol and blood lipids  You should start having your blood tested for lipids and cholesterol at 55 years of age, then have this test every 5 years.  You may need to have your cholesterol levels checked more often if: ? Your lipid or cholesterol levels are high. ? You are older than 55 years of age. ? You are at high risk for heart disease. Cancer screening Lung Cancer  Lung cancer screening is recommended for adults 15-23 years old who are at high risk for lung cancer because of a history of smoking.  A yearly low-dose CT scan of the lungs is recommended for people who: ? Currently smoke. ? Have quit within the past 15 years. ? Have at least a 30-pack-year history of smoking. A pack year is smoking an average of one pack of cigarettes a day for 1 year.  Yearly screening should continue until it has been 15 years since you quit.  Yearly screening should stop if you develop a health problem that would prevent you from having lung cancer treatment. Breast Cancer  Practice breast self-awareness. This means understanding how your breasts normally appear and feel.  It also means doing regular breast self-exams. Let your health care provider know about any changes,  no matter how small.  If you are in your 20s or 30s, you should have a clinical breast exam (CBE) by a health care provider every 1-3 years as part of a regular health exam.  If you are 68 or older, have a CBE every year. Also consider having a breast X-ray (mammogram) every year.  If you have a family history of breast cancer, talk to your health care provider about genetic screening.  If you are at high risk for breast cancer, talk to your health care provider about having an MRI and a mammogram every year.  Breast cancer gene (BRCA) assessment is recommended for women who have family members with BRCA-related cancers. BRCA-related cancers include: ? Breast. ? Ovarian. ? Tubal. ? Peritoneal cancers.  Results of the assessment will determine the need for genetic counseling and BRCA1 and BRCA2 testing. Cervical Cancer Your health care provider may recommend that you be screened regularly for cancer of the pelvic organs (ovaries, uterus, and vagina). This screening involves a pelvic examination, including checking for microscopic changes to the surface of your cervix (Pap test). You may be encouraged to have this screening done every 3 years, beginning at age 53.  For women ages 78-65, health care providers may recommend pelvic exams and Pap testing every 3 years, or they may recommend the Pap and pelvic exam, combined with testing for human papilloma virus (HPV), every 5 years. Some types of HPV increase your risk of cervical cancer. Testing for HPV may also be done on women of any age with unclear Pap test results.  Other health care providers may not recommend any screening for nonpregnant women who are considered low risk for pelvic cancer and who do not have symptoms. Ask your health care provider if a screening pelvic exam is right for you.  If you have had past treatment for cervical cancer or a condition that could lead to cancer, you need Pap tests and  screening for cancer for at  least 20 years after your treatment. If Pap tests have been discontinued, your risk factors (such as having a new sexual partner) need to be reassessed to determine if screening should resume. Some women have medical problems that increase the chance of getting cervical cancer. In these cases, your health care provider may recommend more frequent screening and Pap tests. Colorectal Cancer  This type of cancer can be detected and often prevented.  Routine colorectal cancer screening usually begins at 55 years of age and continues through 55 years of age.  Your health care provider may recommend screening at an earlier age if you have risk factors for colon cancer.  Your health care provider may also recommend using home test kits to check for hidden blood in the stool.  A small camera at the end of a tube can be used to examine your colon directly (sigmoidoscopy or colonoscopy). This is done to check for the earliest forms of colorectal cancer.  Routine screening usually begins at age 29.  Direct examination of the colon should be repeated every 5-10 years through 55 years of age. However, you may need to be screened more often if early forms of precancerous polyps or small growths are found. Skin Cancer  Check your skin from head to toe regularly.  Tell your health care provider about any new moles or changes in moles, especially if there is a change in a mole's shape or color.  Also tell your health care provider if you have a mole that is larger than the size of a pencil eraser.  Always use sunscreen. Apply sunscreen liberally and repeatedly throughout the day.  Protect yourself by wearing long sleeves, pants, a wide-brimmed hat, and sunglasses whenever you are outside. Heart disease, diabetes, and high blood pressure  High blood pressure causes heart disease and increases the risk of stroke. High blood pressure is more likely to develop in: ? People who have blood pressure in the  high end of the normal range (130-139/85-89 mm Hg). ? People who are overweight or obese. ? People who are African American.  If you are 7-49 years of age, have your blood pressure checked every 3-5 years. If you are 68 years of age or older, have your blood pressure checked every year. You should have your blood pressure measured twice-once when you are at a hospital or clinic, and once when you are not at a hospital or clinic. Record the average of the two measurements. To check your blood pressure when you are not at a hospital or clinic, you can use: ? An automated blood pressure machine at a pharmacy. ? A home blood pressure monitor.  If you are between 30 years and 10 years old, ask your health care provider if you should take aspirin to prevent strokes.  Have regular diabetes screenings. This involves taking a blood sample to check your fasting blood sugar level. ? If you are at a normal weight and have a low risk for diabetes, have this test once every three years after 55 years of age. ? If you are overweight and have a high risk for diabetes, consider being tested at a younger age or more often. Preventing infection Hepatitis B  If you have a higher risk for hepatitis B, you should be screened for this virus. You are considered at high risk for hepatitis B if: ? You were born in a country where hepatitis B is common. Ask your health care  provider which countries are considered high risk. ? Your parents were born in a high-risk country, and you have not been immunized against hepatitis B (hepatitis B vaccine). ? You have HIV or AIDS. ? You use needles to inject street drugs. ? You live with someone who has hepatitis B. ? You have had sex with someone who has hepatitis B. ? You get hemodialysis treatment. ? You take certain medicines for conditions, including cancer, organ transplantation, and autoimmune conditions. Hepatitis C  Blood testing is recommended for: ? Everyone born  from [birth year] through 1967. ? Anyone with known risk factors for hepatitis C. Sexually transmitted infections (STIs)  You should be screened for sexually transmitted infections (STIs) including gonorrhea and chlamydia if: ? You are sexually active and are younger than 55 years of age. ? You are older than 55 years of age and your health care provider tells you that you are at risk for this type of infection. ? Your sexual activity has changed since you were last screened and you are at an increased risk for chlamydia or gonorrhea. Ask your health care provider if you are at risk.  If you do not have HIV, but are at risk, it may be recommended that you take a prescription medicine daily to prevent HIV infection. This is called pre-exposure prophylaxis (PrEP). You are considered at risk if: ? You are sexually active and do not regularly use condoms or know the HIV status of your partner(s). ? You take drugs by injection. ? You are sexually active with a partner who has HIV. Talk with your health care provider about whether you are at high risk of being infected with HIV. If you choose to begin PrEP, you should first be tested for HIV. You should then be tested every 3 months for as long as you are taking PrEP. Pregnancy  If you are premenopausal and you may become pregnant, ask your health care provider about preconception counseling.  If you may become pregnant, take 400 to 800 micrograms (mcg) of folic acid every day.  If you want to prevent pregnancy, talk to your health care provider about birth control (contraception). Osteoporosis and menopause  Osteoporosis is a disease in which the bones lose minerals and strength with aging. This can result in serious bone fractures. Your risk for osteoporosis can be identified using a bone density scan.  If you are 81 years of age or older, or if you are at risk for osteoporosis and fractures, ask your health care provider if you should be  screened.  Ask your health care provider whether you should take a calcium or vitamin D supplement to lower your risk for osteoporosis.  Menopause may have certain physical symptoms and risks.  Hormone replacement therapy may reduce some of these symptoms and risks. Talk to your health care provider about whether hormone replacement therapy is right for you. Follow these instructions at home:  Schedule regular health, dental, and eye exams.  Stay current with your immunizations.  Do not use any tobacco products including cigarettes, chewing tobacco, or electronic cigarettes.  If you are pregnant, do not drink alcohol.  If you are breastfeeding, limit how much and how often you drink alcohol.  Limit alcohol intake to no more than 1 drink per day for nonpregnant women. One drink equals 12 ounces of beer, 5 ounces of wine, or 1 ounces of hard liquor.  Do not use street drugs.  Do not share needles.  Ask your health care  provider for help if you need support or information about quitting drugs.  Tell your health care provider if you often feel depressed.  Tell your health care provider if you have ever been abused or do not feel safe at home. This information is not intended to replace advice given to you by your health care provider. Make sure you discuss any questions you have with your health care provider. Document Released: 10/31/2010 Document Revised: 09/23/2015 Document Reviewed: 01/19/2015 Elsevier Interactive Patient Education  2019 [employer].

## 2018-05-20 NOTE — Progress Notes (Signed)
BP 112/68   Pulse 79   Temp 97.6 F (36.4 C) (Oral)   Ht '5\' 3"'$  (1.6 m)   Wt 182 lb 6.4 oz (82.7 kg)   SpO2 95%   BMI 32.31 kg/m    Subjective:    Patient ID: Stephanie Pineda, female    DOB: [birth year], 53 y.o.   MRN: [id number]  HPI: Stephanie Pineda is a 53 y.o. female  Chief Complaint  Patient presents with  . Annual Exam  . paperwork    HPI Patient is here for physical  USPSTF grade A and B recommendations Depression:  Depression screen [facility] 2/9 05/20/2018 05/16/2017 01/10/2016 01/05/2015  Decreased Interest 0 0 0 0  Down, Depressed, Hopeless 0 0 0 0  PHQ - 2 Score 0 0 0 0  Altered sleeping 0 - - -  Tired, decreased energy 0 - - -  Change in appetite 0 - - -  Feeling bad or failure about yourself  0 - - -  Trouble concentrating 0 - - -  Moving slowly or fidgety/restless 0 - - -  Suicidal thoughts 0 - - -  PHQ-9 Score 0 - - -  Difficult doing work/chores Not difficult at all - - -   Hypertension: BP Readings from Last 3 Encounters:  05/20/18 112/68  06/07/17 110/70  05/16/17 124/80   Obesity: Wt Readings from Last 3 Encounters:  05/20/18 182 lb 6.4 oz (82.7 kg)  05/16/17 179 lb 8 oz (81.4 kg)  08/25/16 172 lb 3.2 oz (78.1 kg)   BMI Readings from Last 3 Encounters:  05/20/18 32.31 kg/m  05/16/17 31.80 kg/m  08/25/16 29.04 kg/m    Skin cancer: nothing worrisome Lung cancer:  Former smoker; quit 12 years ago Breast cancer: due for mammo Colorectal cancer: 2016; no family hx; they gave her a 10 year pass she says Cervical cancer screening: last in 2017; do today BRCA gene screening: family hx of breast and/or ovarian cancer and/or metastatic prostate cancer? no HIV, hep B, hep C: no new exposures STD testing and prevention (chl/gon/syphilis): no new exposures Intimate partner violence: no abuse Contraception: not an issue any more; LMP 2009 Osteoporosis: no steroids Fall prevention/vitamin D: discussed; getting from the sun Immunizations: tetanus and flu  shot UTD; declined shingrix Diet: drinking 80 ounces a day; will try to watch her diet more; jumps up and walks every chance she gets; taking a vitamin complex Exercise: exercising 5x a week; extra walking Alcohol:    Office Visit from 05/20/2018 in [facility]  AUDIT-C Score  2     Tobacco use: quit AAA: n/a Aspirin:  Not taking aspirin The 10-year ASCVD risk score Mikey Bussing DC Jr., et al., 2013) is: 1.1%   Values used to calculate the score:     Age: 19 years     Sex: Female     Is Non-Hispanic African American: No     Diabetic: No     Tobacco smoker: No     Systolic Blood Pressure: 053 mmHg     Is BP treated: No     HDL Cholesterol: 66 mg/dL     Total Cholesterol: 197 mg/dL  Glucose:  Glucose  Date Value Ref Range Status  05/02/2017 95 65 - 99 mg/dL Final  01/05/2015 94 65 - 99 mg/dL Final   Glucose, Bld  Date Value Ref Range Status  01/10/2016 92 65 - 99 mg/dL Final   Lipids:  Lab Results  Component Value Date  CHOL 197 05/02/2017   CHOL 224 (H) 01/10/2016   CHOL 200 (H) 01/05/2015   Lab Results  Component Value Date   HDL 66 05/02/2017   HDL 80 01/10/2016   HDL 77 01/05/2015   Lab Results  Component Value Date   LDLCALC 114 (H) 05/02/2017   LDLCALC 128 01/10/2016   LDLCALC 111 (H) 01/05/2015   Lab Results  Component Value Date   TRIG 84 05/02/2017   TRIG 80 01/10/2016   TRIG 59 01/05/2015   Lab Results  Component Value Date   CHOLHDL 3.0 05/02/2017   CHOLHDL 2.8 01/10/2016   No results found for: LDLDIRECT   Depression screen [facility] 2/9 05/20/2018 05/16/2017 01/10/2016 01/05/2015  Decreased Interest 0 0 0 0  Down, Depressed, Hopeless 0 0 0 0  PHQ - 2 Score 0 0 0 0  Altered sleeping 0 - - -  Tired, decreased energy 0 - - -  Change in appetite 0 - - -  Feeling bad or failure about yourself  0 - - -  Trouble concentrating 0 - - -  Moving slowly or fidgety/restless 0 - - -  Suicidal thoughts 0 - - -  PHQ-9 Score 0 - - -  Difficult  doing work/chores Not difficult at all - - -   Fall Risk  05/20/2018 05/16/2017 01/10/2016  Falls in the past year? 1 No Yes  Number falls in past yr: 1 - 2 or more  Injury with Fall? 0 - No    Relevant past medical, surgical, family and social history reviewed Past Medical History:  Diagnosis Date  . Arthritis    right hip, left knee  . Bruised    Pelvic bone - right side  . Cervical cancer ([place])    1986   laser surg tx per pt  . Family history of adverse reaction to anesthesia    Brother "flat-lined" during appendectomy > 20 yrs ago. Has had stents placed since without issues.  Marland Kitchen Heart murmur    followed by PCP  . History of pneumonia 1983  . Hypercholesteremia   . Obesity    Past Surgical History:  Procedure Laterality Date  . COLONOSCOPY WITH PROPOFOL N/A 02/18/2015   Procedure: COLONOSCOPY WITH PROPOFOL;  Surgeon: Lucilla Lame, MD;  Location: [place];  Service: Endoscopy;  Laterality: N/A;  . ENDOMETRIAL ABLATION  2009   laser surgery for Dysplasia  . ESOPHAGOGASTRODUODENOSCOPY    . TUBAL LIGATION     Family History  Problem Relation Age of Onset  . Asthma Mother   . Hyperlipidemia Mother   . Thyroid disease Mother   . COPD Mother   . Osteoporosis Mother   . Heart disease Mother   . Hyperlipidemia Father   . Heart disease Father   . Hypertension Father   . COPD Father   . Glaucoma Father   . Cancer Sister        melanoma  . Diabetes Brother   . Heart disease Brother   . Hypertension Brother   . Thyroid disease Daughter   . Diabetes Maternal Grandmother   . Lymphoma Brother    Social History   Tobacco Use  . Smoking status: Former Smoker    Packs/day: 1.00    Years: 20.00    Pack years: 20.00    Types: Cigarettes    Last attempt to quit: 05/01/2006    Years since quitting: 12.0  . Smokeless tobacco: Never Used  Substance Use Topics  . Alcohol  use: Yes    Comment: 2 a month  . Drug use: No     Office Visit from 05/20/2018 in [facility]  AUDIT-C Score  2      Interim medical history since last visit reviewed. Allergies and medications reviewed  Review of Systems  Constitutional: Positive for unexpected weight change (she is working hard and surprised at weight).  HENT: Negative for nosebleeds.   Eyes: Negative for visual disturbance.  Respiratory: Negative for wheezing.   Cardiovascular: Negative for chest pain.  Gastrointestinal: Negative for blood in stool.  Endocrine: Negative for polydipsia.  Genitourinary: Negative for hematuria.  Musculoskeletal: Negative for joint swelling.  Skin: Negative for rash.  Allergic/Immunologic: Negative for food allergies.  Neurological: Negative for tremors.  Hematological: Negative for adenopathy. Does not bruise/bleed easily.  Psychiatric/Behavioral: Negative for dysphoric mood.   Per HPI unless specifically indicated above     Objective:    BP 112/68   Pulse 79   Temp 97.6 F (36.4 C) (Oral)   Ht '5\' 3"'$  (1.6 m)   Wt 182 lb 6.4 oz (82.7 kg)   SpO2 95%   BMI 32.31 kg/m   Wt Readings from Last 3 Encounters:  05/20/18 182 lb 6.4 oz (82.7 kg)  05/16/17 179 lb 8 oz (81.4 kg)  08/25/16 172 lb 3.2 oz (78.1 kg)    Physical Exam Exam conducted with a chaperone present.  Constitutional:      Appearance: Normal appearance. She is well-developed.  HENT:     Head: Normocephalic and atraumatic.  Eyes:     General: No scleral icterus.       Right eye: No hordeolum.        Left eye: No hordeolum.     Conjunctiva/sclera: Conjunctivae normal.  Neck:     Thyroid: No thyromegaly.     Vascular: No carotid bruit.  Cardiovascular:     Rate and Rhythm: Normal rate and regular rhythm.  No extrasystoles are present.    Heart sounds: Normal heart sounds, S1 normal and S2 normal.  Pulmonary:     Effort: Pulmonary effort is normal. No respiratory distress.     Breath sounds: Normal breath sounds.  Chest:     Breasts: Breasts are symmetrical.         Right: No inverted nipple, mass, nipple discharge, skin change or tenderness.        Left: No inverted nipple, mass, nipple discharge, skin change or tenderness.  Abdominal:     General: Bowel sounds are normal. There is no distension or abdominal bruit.     Palpations: Abdomen is soft. There is no mass or pulsatile mass.     Tenderness: There is no abdominal tenderness.     Hernia: No hernia is present.  Genitourinary:    Exam position: Prone.     Labia:        Right: No rash or lesion.        Left: No rash or lesion.      Cervix: No cervical motion tenderness.     Adnexa:        Right: No mass, tenderness or fullness.         Left: No mass, tenderness or fullness.    Musculoskeletal: Normal range of motion.  Lymphadenopathy:     Head:     Right side of head: No submandibular adenopathy.     Left side of head: No submandibular adenopathy.     Cervical: No cervical adenopathy.  Skin:    General: Skin is warm and dry.     Coloration: Skin is not pale.     Findings: No bruising or ecchymosis.  Neurological:     Mental Status: She is alert.     Cranial Nerves: No cranial nerve deficit.     Motor: No tremor or abnormal muscle tone.     Gait: Gait normal.  Psychiatric:        Mood and Affect: Mood is not anxious or depressed.        Speech: Speech normal.        Behavior: Behavior normal.        Thought Content: Thought content normal.     Results for orders placed or performed in visit on 06/21/17  Vitamin D (25 hydroxy)  Result Value Ref Range   Vit D, 25-Hydroxy 29.1 (L) 30.0 - 100.0 ng/mL      Assessment & Plan:   Problem List Items Addressed This Visit      Other   Preventative health care - Primary    USPSTF grade A and B recommendations reviewed with patient; age-appropriate recommendations, preventive care, screening tests, etc discussed and encouraged; healthy living encouraged; see AVS for patient education given to patient       Relevant Orders   CBC    COMPLETE METABOLIC PANEL WITH GFR   Lipid panel   TSH   Obesity (BMI 30.0-34.9)    Encouraged weight loss       Other Visit Diagnoses    Screening for breast cancer       Relevant Orders   MM 3D SCREEN BREAST BILATERAL   Cervical cancer screening       Relevant Orders   Cytology - PAP   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  An after-visit summary was printed and given to the patient at [facility].  Please see the patient instructions which may contain other information and recommendations beyond what is mentioned above in the assessment and plan.  No orders of the defined types were placed in this encounter.   Orders Placed This Encounter  Procedures  . MM 3D SCREEN BREAST BILATERAL  . CBC  . COMPLETE METABOLIC PANEL WITH GFR  . Lipid panel  . TSH

## 2018-05-20 NOTE — Assessment & Plan Note (Signed)
USPSTF grade A and B recommendations reviewed with patient; age-appropriate recommendations, preventive care, screening tests, etc discussed and encouraged; healthy living encouraged; see AVS for patient education given to patient  

## 2018-05-20 NOTE — Assessment & Plan Note (Signed)
Encouraged weight loss 

## 2018-05-21 LAB — COMPLETE METABOLIC PANEL WITH GFR
AG RATIO: 1.5 (calc) (ref 1.0–2.5)
ALBUMIN MSPROF: 4.5 g/dL (ref 3.6–5.1)
ALKALINE PHOSPHATASE (APISO): 55 U/L (ref 33–130)
ALT: 22 U/L (ref 6–29)
AST: 19 U/L (ref 10–35)
BUN: 10 mg/dL (ref 7–25)
CALCIUM: 9.8 mg/dL (ref 8.6–10.4)
CO2: 27 mmol/L (ref 20–32)
Chloride: 104 mmol/L (ref 98–110)
Creat: 0.73 mg/dL (ref 0.50–1.05)
GFR, EST NON AFRICAN AMERICAN: 93 mL/min/{1.73_m2} (ref >=60)
GFR, Est African American: 108 mL/min/{1.73_m2} (ref >=60)
Globulin: 3.1 g/dL (calc) (ref 1.9–3.7)
Glucose, Bld: 90 mg/dL (ref 65–99)
POTASSIUM: 4.5 mmol/L (ref 3.5–5.3)
Sodium: 142 mmol/L (ref 135–146)
Total Bilirubin: 0.5 mg/dL (ref 0.2–1.2)
Total Protein: 7.6 g/dL (ref 6.1–8.1)

## 2018-05-21 LAB — LIPID PANEL
Cholesterol: 231 mg/dL — ABNORMAL HIGH (ref <200)
HDL: 68 mg/dL (ref >50)
LDL Cholesterol (Calc): 143 mg/dL (calc) — ABNORMAL HIGH
NON-HDL CHOLESTEROL (CALC): 163 mg/dL — AB (ref <130)
Total CHOL/HDL Ratio: 3.4 (calc) (ref <5.0)
Triglycerides: 90 mg/dL (ref <150)

## 2018-05-21 LAB — CBC
HCT: 41.6 % (ref 35.0–45.0)
Hemoglobin: 14.3 g/dL (ref 11.7–15.5)
MCH: 29.7 pg (ref 27.0–33.0)
MCHC: 34.4 g/dL (ref 32.0–36.0)
MCV: 86.5 fL (ref 80.0–100.0)
MPV: 10.7 fL (ref 7.5–12.5)
PLATELETS: 257 10*3/uL (ref 140–400)
RBC: 4.81 10*6/uL (ref 3.80–5.10)
RDW: 12.8 % (ref 11.0–15.0)
WBC: 3.9 10*3/uL (ref 3.8–10.8)

## 2018-05-21 LAB — TSH: TSH: 1.66 mIU/L

## 2018-05-23 LAB — CYTOLOGY - PAP
Diagnosis: NEGATIVE
HPV: NOT DETECTED

## 2018-05-30 ENCOUNTER — Encounter: Payer: Self-pay | Admitting: Family Medicine

## 2018-05-30 DIAGNOSIS — N951 Menopausal and female climacteric states: Secondary | ICD-10-CM

## 2018-05-30 DIAGNOSIS — R232 Flushing: Secondary | ICD-10-CM

## 2018-06-10 NOTE — Addendum Note (Signed)
Addended by: Deaun Rocha, Satira Anis on: 06/10/2018 05:57 PM   Modules accepted: Orders

## 2018-06-11 ENCOUNTER — Telehealth: Payer: Self-pay | Admitting: Obstetrics & Gynecology

## 2018-06-11 NOTE — Telephone Encounter (Signed)
[facility] referring for Menopausal symptoms; interested in estrogen but LMP was years ago. Called and left voicemail for patient to call back to be schedule

## 2018-06-27 ENCOUNTER — Encounter: Payer: Self-pay | Admitting: Obstetrics and Gynecology

## 2018-06-28 ENCOUNTER — Encounter: Payer: Self-pay | Admitting: Obstetrics and Gynecology

## 2018-06-28 ENCOUNTER — Ambulatory Visit (INDEPENDENT_AMBULATORY_CARE_PROVIDER_SITE_OTHER): Payer: Managed Care, Other (non HMO) | Admitting: Obstetrics and Gynecology

## 2018-06-28 VITALS — BP 118/82 | HR 90 | Ht 63.0 in | Wt 190.0 lb

## 2018-06-28 DIAGNOSIS — N951 Menopausal and female climacteric states: Secondary | ICD-10-CM

## 2018-06-28 NOTE — Patient Instructions (Addendum)
We discussed WHI study findings in detail.  In the combined estrogen-progesterone arm breast cancer risk was increased by 1.26 (CI of 1.00 to 1.59), coronary heart disease 1.29 (CI 1.02-1.63), stroke risk 1.41 (1.07-1.85), and pulmonary embolism 2.13 (CI 1.39-3.25).  That being said the while statistically significant the actual number of cases attributable are relatively small at an addition 8 cases of breast cancer, 7 more coronary artery event, 8 more strokes, and 8 additional case of pulmonary embolism per 10,000 women.  Study was terminated because of the increased breast cancer risk, this was not seen in the progestin only arm of the study for women without an intact uterus.  In addition it is important to note that HRT also had positive or risk reducing effects, and all cause mortality between the HRT/non-HRT users is not statistically different.  Estrogen-progestin HRT decreased the relative risk of hip fracture 0.66 (CI 0.45-0.98), colorectal cancer 0.63 (0.43-0.92).  Current consensus is to limit dose to the lowest effective dose, and shortest treatment duration possible.  Breast cancer risk appeared to increase after 4 years of use.  Also important to note is that these risk refer to systemic HRT for the treatment of vasomotor symptoms, and do not apply to vaginal preperations with minimal systemic absorption and aimed at treating symptoms of vulvovaginal atrophy.    We briefly touched on findings of WHIMS trial published in 2005 which looked at women 45 year of age or older, and whether HRT was protective against the development of dementia.  The study revealed that HRT actually increased the risk for the development of dementia but was limited by looking only at patients 51 years of age and older.  The subsequent KEEPS trial  In 2012 which looked at HRT in recently postmenopausal women did not show any improvement in cognitive function for women on HRT.  However, there was also no significant  cognitive declines seen in recently postmenopausal women receiving HRT as had previously been shown in the [facility] trial.

## 2018-06-28 NOTE — Progress Notes (Signed)
Gynecology H&P  Chief Complaint:  Chief Complaint  Patient presents with  . Menopause    Discuss hormone therapy    History of Present Illness:  53 y.o. G2P0011 presenting for consultation at the request of Enid Derry, MD at [facility] for evaluation of menopausal symptoms. Symptom onset was several years ago and symptoms have been worsening.  The patient is not still menstruating secondary to history of prior endometrial ablation.  She is not currently on any HRT or hormonal medications.  Her surgical history is notable for intact uterus and ovaries.  She reports amenorrhea with no bleeding concerns.  Vasomotor symptoms significant with pronounced sleep disturbances, and limitations in quality of life.  Associated symptoms include fatigue, memory problems, vaginal dryness and dyspareunia.  Contributory past medical history includes hypertension, thyroid disease, breast cancer history, heart disease, stroke and personal history of DVT or PE.  She has not previously self treated or been under treatment by another provider for these symptoms.    Review of Systems: 10 point review of systems negative unless otherwise noted in HPI  Past Medical History:  Past Medical History:  Diagnosis Date  . Abnormal Pap smear of cervix Dysplasia  . Arthritis    right hip, left knee  . Bruised    Pelvic bone - right side  . Family history of adverse reaction to anesthesia    Brother "flat-lined" during appendectomy > 20 yrs ago. Has had stents placed since without issues.  Marland Kitchen Heart murmur    followed by PCP  . History of pneumonia 1983  . Hypercholesteremia   . Obesity     Past Surgical History:  Past Surgical History:  Procedure Laterality Date  . COLONOSCOPY WITH PROPOFOL N/A 02/18/2015   Procedure: COLONOSCOPY WITH PROPOFOL;  Surgeon: Lucilla Lame, MD;  Location: [place];  Service: Endoscopy;  Laterality: N/A;  . ENDOMETRIAL ABLATION  2009   laser surgery for  Dysplasia  . ESOPHAGOGASTRODUODENOSCOPY    . KNEE SURGERY    . TUBAL LIGATION      Family History:  Family History  Problem Relation Age of Onset  . Asthma Mother   . Hyperlipidemia Mother   . Thyroid disease Mother   . COPD Mother   . Osteoporosis Mother   . Heart disease Mother   . Hyperlipidemia Father   . Heart disease Father   . Hypertension Father   . COPD Father   . Glaucoma Father   . Cancer Sister        melanoma  . Diabetes Brother   . Heart disease Brother   . Hypertension Brother   . Thyroid disease Daughter   . Diabetes Maternal Grandmother   . Lymphoma Brother     Social History:  Social History   Socioeconomic History  . Marital status: Married    Spouse name: Lanny Hurst  . Number of children: 1  . Years of education: 1  . Highest education level: Some college, no degree  Occupational History  . Not on file  Social Needs  . Financial resource strain: Not hard at all  . Food insecurity:    Worry: Never true    Inability: Never true  . Transportation needs:    Medical: No    Non-medical: No  Tobacco Use  . Smoking status: Former Smoker    Packs/day: 1.00    Years: 20.00    Pack years: 20.00    Types: Cigarettes    Last attempt to quit: 05/01/2006  Years since quitting: 12.1  . Smokeless tobacco: Never Used  Substance and Sexual Activity  . Alcohol use: Yes    Comment: 2 a month  . Drug use: No  . Sexual activity: Not Currently    Birth control/protection: Surgical    Comment: Ablation   Lifestyle  . Physical activity:    Days per week: 5 days    Minutes per session: 70 min  . Stress: Not on file  Relationships  . Social connections:    Talks on phone: Once a week    Gets together: Once a week    Attends religious service: More than 4 times per year    Active member of club or organization: No    Attends meetings of clubs or organizations: Never    Relationship status: Married  . Intimate partner violence:    Fear of current or ex  partner: No    Emotionally abused: No    Physically abused: No    Forced sexual activity: No  Other Topics Concern  . Not on file  Social History Narrative  . Not on file    Allergies:  No Known Allergies  Medications: Prior to Admission medications   Not on File    Physical Exam Vitals: Blood pressure 118/82, pulse 90, height 5\' 3"  (1.6 m), weight 190 lb (86.2 kg).  General: NAD HEENT: normocephalic, anicteric Pulmonary: No increased work of breathing Neurologic: Grossly intact Psychiatric: mood appropriate, affect full  Assessment: 53 y.o. G2P0011 presenting for evaluation of postmenopausal bleeding  Plan: Problem List Items Addressed This Visit    None    Visit Diagnoses    Vasomotor symptoms due to menopause    -  Primary   Relevant Orders   FSH   Estradiol      1)  Vasomotor symptoms - determination of menopausal status is hampered by history of prior endometrial ablation. The average age of menopause in the [place] is 11 with significant variability though.   We discussed WHI study findings in detail.  In the combined estrogen-progesterone arm breast cancer risk was increased by 1.26 (CI of 1.00 to 1.59), coronary heart disease 1.29 (CI 1.02-1.63), stroke risk 1.41 (1.07-1.85), and pulmonary embolism 2.13 (CI 1.39-3.25).  That being said the while statistically significant the actual number of cases attributable are relatively small at an addition 8 cases of breast cancer, 7 more coronary artery event, 8 more strokes, and 8 additional case of pulmonary embolism per 10,000 women.  Study was terminated because of the increased breast cancer risk, this was not seen in the progestin only arm of the study for women without an intact uterus.  In addition it is important to note that HRT also had positive or risk reducing effects, and all cause mortality between the HRT/non-HRT users is not statistically different.  Estrogen-progestin HRT decreased the relative risk of hip  fracture 0.66 (CI 0.45-0.98), colorectal cancer 0.63 (0.43-0.92).  Current consensus is to limit dose to the lowest effective dose, and shortest treatment duration possible.  Breast cancer risk appeared to increase after 4 years of use.  Also important to note is that these risk refer to systemic HRT for the treatment of vasomotor symptoms, and do not apply to vaginal preperations with minimal systemic absorption and aimed at treating symptoms of vulvovaginal atrophy.    We briefly touched on findings of WHIMS trial published in 2005 which looked at women 72 year of age or older, and whether HRT was protective against the  development of dementia.  The study revealed that HRT actually increased the risk for the development of dementia but was limited by looking only at patients 76 years of age and older.  The subsequent KEEPS trial  In 2012 which looked at HRT in recently postmenopausal women did not show any improvement in cognitive function for women on HRT.  However, there was also no significant cognitive declines seen in recently postmenopausal women receiving HRT as had previously been shown in the WHIMS trial.   [facility] and estradiol level obtained today - If perimenopausal trial of gabapentin - if postmenopausal patient ok with HRT - Will assess treatment response in 2 month to determine need for titration  3) Return in about 2 months (around 08/27/2018), or if symptoms worsen or fail to improve, for medication follow up.   Malachy Mood, MD, [facility], [facility] 06/28/2018, 10:03 AM

## 2018-06-29 LAB — ESTRADIOL: Estradiol: <5 pg/mL

## 2018-06-29 LAB — FOLLICLE STIMULATING HORMONE: FSH: 64.2 m[IU]/mL

## 2018-07-02 ENCOUNTER — Telehealth: Payer: Self-pay

## 2018-07-02 NOTE — Telephone Encounter (Signed)
Pt saw AMS on Friday.  AMS was supposed to call her yesterday.  She hasn't heard from him.  Will he call or does she need to cancel appt in 8wks meaning everything is alright?

## 2018-07-02 NOTE — Telephone Encounter (Signed)
advise

## 2018-07-03 ENCOUNTER — Telehealth: Payer: Self-pay | Admitting: Obstetrics and Gynecology

## 2018-07-03 ENCOUNTER — Other Ambulatory Visit: Payer: Self-pay | Admitting: Obstetrics and Gynecology

## 2018-07-03 MED ORDER — ESTRADIOL-LEVONORGESTREL 0.045-0.015 MG/DAY TD PTWK: 1.0000 | MEDICATED_PATCH | TRANSDERMAL | 12 refills | Status: DC

## 2018-07-03 NOTE — Telephone Encounter (Signed)
Patient is calling to speak with AMS. Please call and advise. Thank you

## 2018-07-03 NOTE — Telephone Encounter (Signed)
Patient stated results are in [place] but that MyChart is currently down at the moment. Pt called to let us know her phone will be on and waiting for call.  Thanks!

## 2018-07-03 NOTE — Telephone Encounter (Signed)
AMS called and left voicemail and sent MyChart message

## 2018-07-10 ENCOUNTER — Other Ambulatory Visit: Payer: Self-pay | Admitting: Obstetrics and Gynecology

## 2018-07-10 MED ORDER — PROGESTERONE MICRONIZED 100 MG PO CAPS: 100.0000 mg | ORAL_CAPSULE | Freq: Every day | ORAL | 11 refills | Status: DC

## 2018-07-10 MED ORDER — ESTRADIOL 0.25 MG/0.25GM TD GEL: 1.0000 | Freq: Every day | TRANSDERMAL | 11 refills | Status: DC

## 2018-08-26 ENCOUNTER — Ambulatory Visit (INDEPENDENT_AMBULATORY_CARE_PROVIDER_SITE_OTHER): Payer: Managed Care, Other (non HMO) | Admitting: Obstetrics and Gynecology

## 2018-08-26 ENCOUNTER — Encounter: Payer: Self-pay | Admitting: Obstetrics and Gynecology

## 2018-08-26 ENCOUNTER — Other Ambulatory Visit: Payer: Self-pay

## 2018-08-26 DIAGNOSIS — N951 Menopausal and female climacteric states: Secondary | ICD-10-CM

## 2018-08-26 MED ORDER — ESTRADIOL 1 MG PO TABS: 1.0000 mg | ORAL_TABLET | Freq: Every day | ORAL | 3 refills | Status: DC

## 2018-08-26 NOTE — Progress Notes (Signed)
I discussed the limitations, risks, security and privacy concerns of performing an evaluation and management service by telephone and the availability of in person appointments. I also discussed with the patient that there may be a patient responsible charge related to this service. The patient expressed understanding and agreed to proceed.  The patient was at home I spoke with the patient from my workstation phone The names of people involved in this encounter were: Carolanne Grumbling , and Malachy Mood   Obstetrics & Gynecology Office Visit   Chief Complaint:  Chief Complaint  Patient presents with  . Follow-up    Medication follow up/ A few hot flashes    History of Present Illness: 53 y.o. G2P0011 presenting for medication follow up for a diagnosis of vasomotor symptoms associated with menopause",vasomotor symptoms associated with perimenpause.  She is currently being managed with systemic HRT.   The patient reports improvement in symptoms but continued vasomotor symptoms (milder but more prominent than on first week of starting premarin).  On her current medication regimen has not had any bleeding concerns.   She has not noted any side-effects or new symptoms.    Does find Divigel slightly inconvenient as far as time required for application to dry.  Review of Systems: Review of Systems  Constitutional: Negative.   Gastrointestinal: Negative.   Neurological: Negative for headaches.     Past Medical History:  Past Medical History:  Diagnosis Date  . Abnormal Pap smear of cervix Dysplasia  . Arthritis    right hip, left knee  . Bruised    Pelvic bone - right side  . Family history of adverse reaction to anesthesia    Brother "flat-lined" during appendectomy > 20 yrs ago. Has had stents placed since without issues.  Marland Kitchen Heart murmur    followed by PCP  . History of pneumonia 1983  . Hypercholesteremia   . Obesity     Past Surgical History:  Past Surgical History:   Procedure Laterality Date  . COLONOSCOPY WITH PROPOFOL N/A 02/18/2015   Procedure: COLONOSCOPY WITH PROPOFOL;  Surgeon: Lucilla Lame, MD;  Location: [facility];  Service: Endoscopy;  Laterality: N/A;  . ENDOMETRIAL ABLATION  2009   laser surgery for Dysplasia  . ESOPHAGOGASTRODUODENOSCOPY    . KNEE SURGERY    . TUBAL LIGATION      Gynecologic History: No LMP recorded. Patient has had an ablation.  Obstetric History: G2P0011  Family History:  Family History  Problem Relation Age of Onset  . Asthma Mother   . Hyperlipidemia Mother   . Thyroid disease Mother   . COPD Mother   . Osteoporosis Mother   . Heart disease Mother   . Hyperlipidemia Father   . Heart disease Father   . Hypertension Father   . COPD Father   . Glaucoma Father   . Cancer Sister        melanoma  . Diabetes Brother   . Heart disease Brother   . Hypertension Brother   . Thyroid disease Daughter   . Diabetes Maternal Grandmother   . Lymphoma Brother     Social History:  Social History   Socioeconomic History  . Marital status: Married    Spouse name: Lanny Hurst  . Number of children: 1  . Years of education: 48  . Highest education level: Some college, no degree  Occupational History  . Not on file  Social Needs  . Financial resource strain: Not hard at all  .  Food insecurity:    Worry: Never true    Inability: Never true  . Transportation needs:    Medical: No    Non-medical: No  Tobacco Use  . Smoking status: Former Smoker    Packs/day: 1.00    Years: 20.00    Pack years: 20.00    Types: Cigarettes    Last attempt to quit: 05/01/2006    Years since quitting: 12.3  . Smokeless tobacco: Never Used  Substance and Sexual Activity  . Alcohol use: Yes    Comment: 2 a month  . Drug use: No  . Sexual activity: Not Currently    Birth control/protection: Surgical    Comment: Ablation   Lifestyle  . Physical activity:    Days per week: 5 days    Minutes per session: 70 min  . Stress:  Not on file  Relationships  . Social connections:    Talks on phone: Once a week    Gets together: Once a week    Attends religious service: More than 4 times per year    Active member of club or organization: No    Attends meetings of clubs or organizations: Never    Relationship status: Married  . Intimate partner violence:    Fear of current or ex partner: No    Emotionally abused: No    Physically abused: No    Forced sexual activity: No  Other Topics Concern  . Not on file  Social History Narrative  . Not on file    Allergies:  No Known Allergies  Medications: Prior to Admission medications   Medication Sig Start Date End Date Taking? Authorizing Provider  progesterone (PROMETRIUM) 100 MG capsule Take 1 capsule (100 mg total) by mouth daily. 07/10/18  Yes Malachy Mood, MD  estradiol (ESTRACE) 1 MG tablet Take 1 tablet (1 mg total) by mouth daily. 08/26/18   Malachy Mood, MD    Physical Exam Vitals: There were no vitals filed for this visit. No LMP recorded. Patient has had an ablation.  No physical exam as this was a remote telephone visit to promote social distancing during the current COVID-19 Pandemic   Assessment: 53 y.o. [id number] HRT follow up  Plan: Problem List Items Addressed This Visit    None    Visit Diagnoses    Vasomotor symptoms due to menopause    -  Primary     1) HRT - switch Divigel to estrace 1mg  po.  Discussed overall mechanism of action is the same although delivery route is different.  She did not do well on trial of patches but has done well on divigel.  2) Telephone time  9 minutes  3) Return in about 6 weeks (around 10/07/2018) for 6-8 weeks (telephone).     Malachy Mood, MD, [facility], [place]

## 2018-08-27 ENCOUNTER — Ambulatory Visit: Payer: Managed Care, Other (non HMO) | Admitting: Obstetrics and Gynecology

## 2018-09-05 NOTE — Telephone Encounter (Signed)
Not emergent. Will forward to Dr. Georgianne Fick.

## 2018-09-08 ENCOUNTER — Other Ambulatory Visit: Payer: Self-pay | Admitting: Obstetrics and Gynecology

## 2018-09-08 MED ORDER — ESTRADIOL 2 MG PO TABS: 2.0000 mg | ORAL_TABLET | Freq: Every day | ORAL | 11 refills | Status: DC

## 2018-11-28 ENCOUNTER — Other Ambulatory Visit: Payer: Managed Care, Other (non HMO)

## 2018-11-28 ENCOUNTER — Other Ambulatory Visit: Payer: Self-pay

## 2018-11-28 ENCOUNTER — Encounter: Payer: Self-pay | Admitting: Family Medicine

## 2018-11-28 ENCOUNTER — Ambulatory Visit: Payer: Managed Care, Other (non HMO) | Admitting: Family Medicine

## 2018-11-28 VITALS — BP 118/74 | HR 78 | Temp 97.5°F | Resp 14 | Ht 63.0 in | Wt 198.1 lb

## 2018-11-28 DIAGNOSIS — M791 Myalgia, unspecified site: Secondary | ICD-10-CM | POA: Diagnosis not present

## 2018-11-28 DIAGNOSIS — E559 Vitamin D deficiency, unspecified: Secondary | ICD-10-CM

## 2018-11-28 DIAGNOSIS — Z8349 Family history of other endocrine, nutritional and metabolic diseases: Secondary | ICD-10-CM

## 2018-11-28 DIAGNOSIS — R5383 Other fatigue: Secondary | ICD-10-CM | POA: Diagnosis not present

## 2018-11-28 DIAGNOSIS — M2560 Stiffness of unspecified joint, not elsewhere classified: Secondary | ICD-10-CM

## 2018-11-28 NOTE — Progress Notes (Signed)
Lab draw with orders only. Results will be sent to the ordering provider.

## 2018-11-28 NOTE — Progress Notes (Signed)
Name: Stephanie Pineda   MRN: [id number]    DOB: 1966/02/19   Date:11/28/2018       Progress Note  Subjective  Chief Complaint  Chief Complaint  Patient presents with  . Fatigue    weight gain, dry skin, hair lost, muscle pain. Would like thyroid checked    HPI  Pt presents with concern for fatigue, muscle aches, joint pain, hair thinning/loss, weight gain, dry skin (has never had in the past).  She notes bilateral shin pain for about 6 months, joints affected are usually ankles, shins, and sometimes the hands - joint stiffness worse in the middle of the day. Other symptoms for about 8 weeks.  She endorses intermittent constipation - no blood in stool or dark and tarry stools.  She notes a long family history of thyroid disease in her family. - No chest pain or shortness of breath; has had a few episodes of palpitations over the last few months. Declines EKG today.  - She has been under increased stress lately. 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   - She was started on estradiol in May 2020 with her GYN for hot flashes. - She is trying to walk 26.2 miles a week, but this has been more difficult due to her pain and fatigue.  Patient Active Problem List   Diagnosis Date Noted  . Obesity (BMI 30.0-34.9) 05/20/2018  . Vitamin D deficiency 05/16/2017  . 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 Screening for cervical cancer 01/10/2016  . Special screening for malignant neoplasms, colon   . Colon cancer screening 01/05/2015  . Elevated blood pressure 01/05/2015  . Breast cancer screening 01/05/2015  . Preventative health care 01/05/2015  . Hypercholesteremia     Past Surgical History:  Procedure Laterality Date  . COLONOSCOPY WITH PROPOFOL N/A 02/18/2015   Procedure: COLONOSCOPY WITH PROPOFOL;  Surgeon: Lucilla Lame, MD;  Location: [facility];  Service: Endoscopy;  Laterality: N/A;  . ENDOMETRIAL ABLATION  2009   laser  surgery for Dysplasia  . ESOPHAGOGASTRODUODENOSCOPY    . KNEE SURGERY    . TUBAL LIGATION      Family History  Problem Relation Age of Onset  . Asthma Mother   . Hyperlipidemia Mother   . Thyroid disease Mother   . COPD Mother   . Osteoporosis Mother   . Heart disease Mother   . Hyperlipidemia Father   . Heart disease Father   . Hypertension Father   . COPD Father   . Glaucoma Father   . Cancer Sister        melanoma  . Diabetes Brother   . Heart disease Brother   . Hypertension Brother   . Thyroid disease Daughter   . Diabetes Maternal Grandmother   . Lymphoma Brother     Social History   Socioeconomic History  . Marital status: Married    Spouse name: Lanny Hurst  . Number of children: 1  . Years of education: 21  . Highest education level: Some college, no degree  Occupational History  . Not on file  Social Needs  . Financial resource strain: Not hard at all  . Food insecurity    Worry: Never true    Inability: Never true  . Transportation needs    Medical: No    Non-medical: No  Tobacco Use  . Smoking status: Former Smoker    Packs/day: 1.00    Years:  20.00    Pack years: 20.00    Types: Cigarettes    Quit date: 05/01/2006    Years since quitting: 12.5  . Smokeless tobacco: Never Used  Substance and Sexual Activity  . Alcohol use: Yes    Comment: 2 a month  . Drug use: No  . Sexual activity: Not Currently    Birth control/protection: Surgical    Comment: Ablation   Lifestyle  . Physical activity    Days per week: 5 days    Minutes per session: 70 min  . Stress: Not on file  Relationships  . Social Herbalist on phone: Once a week    Gets together: Once a week    Attends religious service: More than 4 times per year    Active member of club or organization: No    Attends meetings of clubs or organizations: Never    Relationship status: Married  . Intimate partner violence    Fear of current or ex partner: No    Emotionally abused: No     Physically abused: No    Forced sexual activity: No  Other Topics Concern  . Not on file  Social History Narrative  . Not on file     Current Outpatient Medications:  .  estradiol (ESTRACE) 2 MG tablet, Take 1 tablet (2 mg total) by mouth daily., Disp: 30 tablet, Rfl: 11 .  progesterone (PROMETRIUM) 100 MG capsule, Take 1 capsule (100 mg total) by mouth daily., Disp: 30 capsule, Rfl: 11  No Known Allergies  I personally reviewed active problem list, medication list, allergies, notes from last encounter, lab results with the patient/caregiver today.   ROS  Constitutional: Negative for fever or weight change.  Respiratory: Negative for cough and shortness of breath.   Cardiovascular: Negative for chest pain or palpitations.  Gastrointestinal: Negative for abdominal pain, no bowel changes.  Musculoskeletal: Negative for gait problem or joint swelling.  Skin: Negative for rash.  Neurological: Negative for dizziness or headache.  No other specific complaints in a complete review of systems (except as listed in HPI above).  Objective  Vitals:   11/28/18 0749  BP: 118/74  Pulse: 78  Resp: 14  Temp: (!) 97.5 F (36.4 C)  TempSrc: Oral  SpO2: 98%  Weight: 198 lb 1.6 oz (89.9 kg)  Height: 5\' 3"  (1.6 m)   Body mass index is 35.09 kg/m.  Physical Exam  Constitutional: Patient appears well-developed and well-nourished. No distress.  HENT: Head: Normocephalic and atraumatic. Eyes: Conjunctivae and EOM are normal. No scleral icterus.  Neck: Normal range of motion. Neck supple. No JVD present. No thyromegaly present.  Cardiovascular: Normal rate, regular rhythm and normal heart sounds.  No murmur heard. No BLE edema. Pulmonary/Chest: Effort normal and breath sounds normal. No respiratory distress. Musculoskeletal: Normal range of motion, no joint effusions. No gross deformities Neurological: Pt is alert and oriented to person, place, and time. No cranial nerve deficit.  Coordination, balance, strength, speech and gait are normal.  Skin: Skin is warm and dry. No rash noted. No erythema.  Psychiatric: Patient has a normal mood and affect. behavior is normal. Judgment and thought content normal.  No results found for this or any previous visit (from the past 72 hour(s)).   PHQ2/9: Depression screen [facility] 2/9 11/28/2018 05/20/2018 05/16/2017 01/10/2016 01/05/2015  Decreased Interest 3 0 0 0 0  Down, Depressed, Hopeless 0 0 0 0 0  PHQ - 2 Score 3 0 0 0  0  Altered sleeping 0 0 - - -  Tired, decreased energy 1 0 - - -  Change in appetite 0 0 - - -  Feeling bad or failure about yourself  1 0 - - -  Trouble concentrating 1 0 - - -  Moving slowly or fidgety/restless 1 0 - - -  Suicidal thoughts 0 0 - - -  PHQ-9 Score 7 0 - - -  Difficult doing work/chores Somewhat difficult Not difficult at all - - -   PHQ-2/9 Result is positive.    Fall Risk: Fall Risk  11/28/2018 05/20/2018 05/16/2017 01/10/2016  Falls in the past year? 0 1 No Yes  Number falls in past yr: 0 1 - 2 or more  Injury with Fall? 0 0 - No  Follow up Falls evaluation completed - - -    Assessment & Plan  1. Fatigue, unspecified type - COMPLETE METABOLIC PANEL WITH GFR - CBC with Differential/Platelet - Magnesium - VITAMIN D 25 Hydroxy (Vit-D Deficiency, Fractures) - B12 and Folate Panel - Thyroid Panel With TSH - Sedimentation rate - C-reactive protein  2. Myalgia - COMPLETE METABOLIC PANEL WITH GFR - CBC with Differential/Platelet - Magnesium - VITAMIN D 25 Hydroxy (Vit-D Deficiency, Fractures) - B12 and Folate Panel - Thyroid Panel With TSH - Sedimentation rate - C-reactive protein  3. Family history of thyroid disease - Thyroid Panel With TSH  4. Vitamin D deficiency - VITAMIN D 25 Hydroxy (Vit-D Deficiency, Fractures)  5. Joint stiffness - Magnesium - B12 and Folate Panel - Sedimentation rate - C-reactive protein    We will start with labs today, may consider EKG for  palpitations in the future.  Recommend tylenol PRN for pain at this time, topical icy hot is okay to use as well.

## 2018-11-29 LAB — CMP14+EGFR
ALT: 19 IU/L (ref 0–32)
AST: 21 IU/L (ref 0–40)
Albumin/Globulin Ratio: 1.5 (ref 1.2–2.2)
Albumin: 4.3 g/dL (ref 3.8–4.9)
Alkaline Phosphatase: 58 IU/L (ref 39–117)
BUN/Creatinine Ratio: 10 (ref 9–23)
BUN: 7 mg/dL (ref 6–24)
Bilirubin Total: 0.3 mg/dL (ref 0.0–1.2)
CO2: 19 mmol/L — ABNORMAL LOW (ref 20–29)
Calcium: 9.3 mg/dL (ref 8.7–10.2)
Chloride: 102 mmol/L (ref 96–106)
Creatinine, Ser: 0.68 mg/dL (ref 0.57–1.00)
GFR calc Af Amer: 115 mL/min/{1.73_m2} (ref >59)
GFR calc non Af Amer: 99 mL/min/{1.73_m2} (ref >59)
Globulin, Total: 2.8 g/dL (ref 1.5–4.5)
Glucose: 98 mg/dL (ref 65–99)
Potassium: 4.5 mmol/L (ref 3.5–5.2)
Sodium: 140 mmol/L (ref 134–144)
Total Protein: 7.1 g/dL (ref 6.0–8.5)

## 2018-11-29 LAB — CBC WITH DIFFERENTIAL/PLATELET
Basophils Absolute: 0 10*3/uL (ref 0.0–0.2)
Basos: 1 %
EOS (ABSOLUTE): 0.1 10*3/uL (ref 0.0–0.4)
Eos: 2 %
Hematocrit: 39.9 % (ref 34.0–46.6)
Hemoglobin: 13.3 g/dL (ref 11.1–15.9)
Immature Grans (Abs): 0 10*3/uL (ref 0.0–0.1)
Immature Granulocytes: 0 %
Lymphocytes Absolute: 1.5 10*3/uL (ref 0.7–3.1)
Lymphs: 32 %
MCH: 29 pg (ref 26.6–33.0)
MCHC: 33.3 g/dL (ref 31.5–35.7)
MCV: 87 fL (ref 79–97)
Monocytes Absolute: 0.4 10*3/uL (ref 0.1–0.9)
Monocytes: 10 %
Neutrophils Absolute: 2.6 10*3/uL (ref 1.4–7.0)
Neutrophils: 55 %
Platelets: 224 10*3/uL (ref 150–450)
RBC: 4.59 x10E6/uL (ref 3.77–5.28)
RDW: 14.2 % (ref 11.7–15.4)
WBC: 4.6 10*3/uL (ref 3.4–10.8)

## 2018-11-29 LAB — B12 AND FOLATE PANEL
Folate: 14.6 ng/mL (ref >3.0)
Vitamin B-12: 427 pg/mL (ref 232–1245)

## 2018-11-29 LAB — SEDIMENTATION RATE: Sed Rate: 8 mm/hr (ref 0–40)

## 2018-11-29 LAB — THYROID PANEL WITH TSH
Free Thyroxine Index: 1.9 (ref 1.2–4.9)
T3 Uptake Ratio: 19 % — ABNORMAL LOW (ref 24–39)
T4, Total: 10 ug/dL (ref 4.5–12.0)
TSH: 1.7 u[IU]/mL (ref 0.450–4.500)

## 2018-11-29 LAB — C-REACTIVE PROTEIN: CRP: 2 mg/L (ref 0–10)

## 2018-11-29 LAB — MAGNESIUM: Magnesium: 2.1 mg/dL (ref 1.6–2.3)

## 2018-11-29 LAB — VITAMIN D 25 HYDROXY (VIT D DEFICIENCY, FRACTURES): Vit D, 25-Hydroxy: 15.7 ng/mL — ABNORMAL LOW (ref 30.0–100.0)

## 2019-01-27 ENCOUNTER — Other Ambulatory Visit: Payer: Self-pay | Admitting: Family Medicine

## 2019-01-27 DIAGNOSIS — Z1231 Encounter for screening mammogram for malignant neoplasm of breast: Secondary | ICD-10-CM

## 2019-01-31 ENCOUNTER — Ambulatory Visit
Admission: RE | Admit: 2019-01-31 | Discharge: 2019-01-31 | Disposition: A | Discharge status: Home / Self Care | Payer: Managed Care, Other (non HMO) | Source: Ambulatory Visit | Attending: Family Medicine | Admitting: Family Medicine

## 2019-01-31 DIAGNOSIS — Z1231 Encounter for screening mammogram for malignant neoplasm of breast: Secondary | ICD-10-CM | POA: Diagnosis present

## 2019-05-23 ENCOUNTER — Ambulatory Visit: Payer: Managed Care, Other (non HMO) | Admitting: Family Medicine

## 2019-05-27 ENCOUNTER — Encounter: Payer: Self-pay | Admitting: Family Medicine

## 2019-05-27 ENCOUNTER — Other Ambulatory Visit: Payer: Self-pay

## 2019-05-27 ENCOUNTER — Ambulatory Visit (INDEPENDENT_AMBULATORY_CARE_PROVIDER_SITE_OTHER): Payer: Managed Care, Other (non HMO) | Admitting: Family Medicine

## 2019-05-27 VITALS — BP 118/78 | HR 77 | Temp 97.2°F | Resp 16 | Ht 63.0 in | Wt 198.0 lb

## 2019-05-27 DIAGNOSIS — R5383 Other fatigue: Secondary | ICD-10-CM

## 2019-05-27 DIAGNOSIS — E559 Vitamin D deficiency, unspecified: Secondary | ICD-10-CM | POA: Diagnosis not present

## 2019-05-27 DIAGNOSIS — Z13820 Encounter for screening for osteoporosis: Secondary | ICD-10-CM

## 2019-05-27 DIAGNOSIS — E7800 Pure hypercholesterolemia, unspecified: Secondary | ICD-10-CM

## 2019-05-27 DIAGNOSIS — Z8349 Family history of other endocrine, nutritional and metabolic diseases: Secondary | ICD-10-CM | POA: Diagnosis not present

## 2019-05-27 DIAGNOSIS — Z01419 Encounter for gynecological examination (general) (routine) without abnormal findings: Secondary | ICD-10-CM

## 2019-05-27 DIAGNOSIS — Z8262 Family history of osteoporosis: Secondary | ICD-10-CM

## 2019-05-27 NOTE — Patient Instructions (Signed)
Preventive Care 40-56 Years Old, Female Old, Female Preventive care refers to visits with your health care provider and lifestyle choices that can promote health and wellness. This includes:  A yearly physical exam. This may also be called an annual well check.  Regular dental visits and eye exams.  Immunizations.  Screening for certain conditions.  Healthy lifestyle choices, such as eating a healthy diet, getting regular exercise, not using drugs or products that contain nicotine and tobacco, and limiting alcohol use. What can I expect for my preventive care visit? Physical exam Your health care provider will check your:  Height and weight. This may be used to calculate body mass index (BMI), which tells if you are at a healthy weight.  Heart rate and blood pressure.  Skin for abnormal spots. Counseling Your health care provider may ask you questions about your:  Alcohol, tobacco, and drug use.  Emotional well-being.  Home and relationship well-being.  Sexual activity.  Eating habits.  Work and work environment.  Method of birth control.  Menstrual cycle.  Pregnancy history. What immunizations do I need?  Influenza (flu) vaccine  This is recommended every year. Tetanus, diphtheria, and pertussis (Tdap) vaccine  You may need a Td booster every 10 years. Varicella (chickenpox) vaccine  You may need this if you have not been vaccinated. Zoster (shingles) vaccine  You may need this after age 60. Measles, mumps, and rubella (MMR) vaccine  You may need at least one dose of MMR if you were born in 1957 or later. You may also need a second dose. Pneumococcal conjugate (PCV13) vaccine  You may need this if you have certain conditions and were not previously vaccinated. Pneumococcal polysaccharide (PPSV23) vaccine  You may need one or two doses if you smoke cigarettes or if you have certain conditions. Meningococcal conjugate (MenACWY) vaccine  You may need this if you  have certain conditions. Hepatitis A vaccine  You may need this if you have certain conditions or if you travel or work in places where you may be exposed to hepatitis A. Hepatitis B vaccine  You may need this if you have certain conditions or if you travel or work in places where you may be exposed to hepatitis B. Haemophilus influenzae type b (Hib) vaccine  You may need this if you have certain conditions. Human papillomavirus (HPV) vaccine  If recommended by your health care provider, you may need three doses over 6 months. You may receive vaccines as individual doses or as more than one vaccine together in one shot (combination vaccines). Talk with your health care provider about the risks and benefits of combination vaccines. What tests do I need? Blood tests  Lipid and cholesterol levels. These may be checked every 5 years, or more frequently if you are over 56 years old.  Hepatitis C test.  Hepatitis B test. Screening  Lung cancer screening. You may have this screening every year starting at age 56 if you have a 30-pack-year history of smoking and currently smoke or have quit within the past 15 years.  Colorectal cancer screening. All adults should have this screening starting at age 56 and continuing until age 75. Your health care provider may recommend screening at age 45 if you are at increased risk. You will have tests every 1-10 years, depending on your results and the type of screening test.  Diabetes screening. This is done by checking your blood sugar (glucose) after you have not eaten for a while (fasting). You may have this   done every 1-3 years.  Mammogram. This may be done every 1-2 years. Talk with your health care provider about when you should start having regular mammograms. This may depend on whether you have a family history of breast cancer.  BRCA-related cancer screening. This may be done if you have a family history of breast, ovarian, tubal, or peritoneal  cancers.  Pelvic exam and Pap test. This may be done every 3 years starting at age 56. Starting at age 56, this may be done every 5 years if you have a Pap test in combination with an HPV test. Other tests  Sexually transmitted disease (STD) testing.  Bone density scan. This is done to screen for osteoporosis. You may have this scan if you are at high risk for osteoporosis. Follow these instructions at home: Eating and drinking  Eat a diet that includes fresh fruits and vegetables, whole grains, lean protein, and low-fat dairy.  Take vitamin and mineral supplements as recommended by your health care provider.  Do not drink alcohol if: ? Your health care provider tells you not to drink. ? You are pregnant, may be pregnant, or are planning to become pregnant.  If you drink alcohol: ? Limit how much you have to 0-1 drink a day. ? Be aware of how much alcohol is in your drink. In the [place] one drink equals one 12 oz bottle of beer (355 mL), one 5 oz glass of wine (148 mL), or one 1 oz glass of hard liquor (44 mL). Lifestyle  Take daily care of your teeth and gums.  Stay active. Exercise for at least 30 minutes on 5 or more days each week.  Do not use any products that contain nicotine or tobacco, such as cigarettes, e-cigarettes, and chewing tobacco. If you need help quitting, ask your health care provider.  If you are sexually active, practice safe sex. Use a condom or other form of birth control (contraception) in order to prevent pregnancy and STIs (sexually transmitted infections).  If told by your health care provider, take low-dose aspirin daily starting at age 56. What's next?  Visit your health care provider once a year for a well check visit.  Ask your health care provider how often you should have your eyes and teeth checked.  Stay up to date on all vaccines. This information is not intended to replace advice given to you by your health care provider. Make sure you  discuss any questions you have with your health care provider. Document Revised: 12/27/2017 Document Reviewed: 12/27/2017 Elsevier Patient Education  2020 [employer].

## 2019-05-27 NOTE — Progress Notes (Signed)
Name: Stephanie Pineda   MRN: [id number]    DOB: 1965-12-19   Date:05/27/2019       Progress Note  Subjective  Chief Complaint  Chief Complaint  Patient presents with  . Annual Exam    HPI  Patient presents for annual CPE.  Diet: Not following particular diet. Does eat fresh fruits and vegetables daily.  Exercise: Went 6 months without the gym due to pandemic, has been back for a few months and feels a bit better now.   USPSTF grade A and B recommendations    Office Visit from 05/27/2019 in [facility]  AUDIT-C Score  1    Occasional use - once a month, 2 glasses   Depression: Phq 9 is  negative Depression screen [facility] 2/9 05/27/2019 11/28/2018 05/20/2018 05/16/2017 01/10/2016  Decreased Interest 0 3 0 0 0  Down, Depressed, Hopeless 0 0 0 0 0  PHQ - 2 Score 0 3 0 0 0  Altered sleeping 1 0 0 - -  Tired, decreased energy 1 1 0 - -  Change in appetite 0 0 0 - -  Feeling bad or failure about yourself  1 1 0 - -  Trouble concentrating 0 1 0 - -  Moving slowly or fidgety/restless 0 1 0 - -  Suicidal thoughts 0 0 0 - -  PHQ-9 Score 3 7 0 - -  Difficult doing work/chores Not difficult at all Somewhat difficult Not difficult at all - -   Hypertension: BP Readings from Last 3 Encounters:  05/27/19 118/78  11/28/18 118/74  06/28/18 118/82   Obesity: Wt Readings from Last 3 Encounters:  05/27/19 198 lb (89.8 kg)  11/28/18 198 lb 1.6 oz (89.9 kg)  06/28/18 190 lb (86.2 kg)   BMI Readings from Last 3 Encounters:  05/27/19 35.07 kg/m  11/28/18 35.09 kg/m  06/28/18 33.66 kg/m     Hep C Screening: Negative in 2018 STD testing and prevention (HIV/chl/gon/syphilis): Negative HIV in 2018.  Declines additional testing today Intimate partner violence: No concerns Sexual History (Partners/Practices/Protection from STI/Past hx STI/Pregnancy Plans):  Pain during Intercourse: Not currently sexually active Menstrual History/LMP/Abnormal Bleeding: LMP 2009, then had  endometrial ablation Incontinence Symptoms: No concerns  Breast cancer:  - Last Mammogram: October 2020 - negative - BRCA gene screening: No family history  Osteoporosis: Discussed high calcium and vitamin D supplementation, weight bearing exercises. Does have family history.  Cervical cancer screening: Pap is UTD - last was 2020, due in 2023.  Skin cancer: Discussed monitoring for atypical lesions, no concerning lesions Colorectal cancer:  Colonoscopy UTD; Denies family or personal history of colorectal cancer, no changes in BM's - no blood in stool, dark and tarry stool, mucus in stool, or constipation/diarrhea.  Lung cancer:  Former 1ppd smoker for about 20 years - does not qualify for CT scan, however she has had some wheezing recently and has had ongoing fatigue for about 6 months now - she declines Chest Xray today, but will call back if wanting to investigate further. Low Dose CT Chest recommended if Age 13-80 years, 30 pack-year currently smoking OR have quit w/in 15years. Patient does not qualify.   ECG: Denies chest pain, shortness of breath, or palpitations  Advanced Care Planning: A voluntary discussion about advance care planning including the explanation and discussion of advance directives.  Discussed health care proxy and Living will, and the patient was not able to identify a health care proxy.  Patient does not have a  living will at present time. If patient does have living will, I have requested they bring this to the clinic to be scanned in to their chart.  Lipids: Lab Results  Component Value Date   CHOL 231 (H) 05/20/2018   CHOL 197 05/02/2017   CHOL 224 (H) 01/10/2016   Lab Results  Component Value Date   HDL 68 05/20/2018   HDL 66 05/02/2017   HDL 80 01/10/2016   Lab Results  Component Value Date   LDLCALC 143 (H) 05/20/2018   LDLCALC 114 (H) 05/02/2017   LDLCALC 128 01/10/2016   Lab Results  Component Value Date   TRIG 90 05/20/2018   TRIG 84  05/02/2017   TRIG 80 01/10/2016   Lab Results  Component Value Date   CHOLHDL 3.4 05/20/2018   CHOLHDL 3.0 05/02/2017   CHOLHDL 2.8 01/10/2016   No results found for: LDLDIRECT  Glucose: Glucose  Date Value Ref Range Status  11/28/2018 98 65 - 99 mg/dL Final  05/02/2017 95 65 - 99 mg/dL Final  01/05/2015 94 65 - 99 mg/dL Final   Glucose, Bld  Date Value Ref Range Status  05/20/2018 90 65 - 99 mg/dL Final    Comment:    .            Fasting reference interval .   01/10/2016 92 65 - 99 mg/dL Final    Patient Active Problem List   Diagnosis Date Noted  . Obesity (BMI 30.0-34.9) 05/20/2018  . Vitamin D deficiency 05/16/2017  . 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 Screening for cervical cancer 01/10/2016  . Special screening for malignant neoplasms, colon   . Colon cancer screening 01/05/2015  . Elevated blood pressure 01/05/2015  . Breast cancer screening 01/05/2015  . Preventative health care 01/05/2015  . Hypercholesteremia     Past Surgical History:  Procedure Laterality Date  . COLONOSCOPY WITH PROPOFOL N/A 02/18/2015   Procedure: COLONOSCOPY WITH PROPOFOL;  Surgeon: Lucilla Lame, MD;  Location: [place];  Service: Endoscopy;  Laterality: N/A;  . ENDOMETRIAL ABLATION  2009   laser surgery for Dysplasia  . ESOPHAGOGASTRODUODENOSCOPY    . KNEE SURGERY    . TUBAL LIGATION      Family History  Problem Relation Age of Onset  . Asthma Mother   . Hyperlipidemia Mother   . Thyroid disease Mother   . COPD Mother   . Osteoporosis Mother   . Heart disease Mother   . Hyperlipidemia Father   . Heart disease Father   . Hypertension Father   . COPD Father   . Glaucoma Father   . Cancer Sister        melanoma  . Diabetes Brother   . Heart disease Brother   . Hypertension Brother   . Thyroid disease Daughter   . Diabetes Maternal Grandmother   . Lymphoma Brother     Social History   Socioeconomic History  . Marital status: Married     Spouse name: Lanny Hurst  . Number of children: 1  . Years of education: 65  . Highest education level: Some college, no degree  Occupational History  . Not on file  Tobacco Use  . Smoking status: Former Smoker    Packs/day: 1.00    Years: 20.00    Pack years: 20.00    Types: Cigarettes    Quit date: 05/01/2006    Years since quitting: 13.0  . Smokeless tobacco: Never Used  Substance and Sexual Activity  . Alcohol  use: Yes    Comment: 2 a month  . Drug use: No  . Sexual activity: Not Currently    Birth control/protection: Surgical    Comment: Ablation   Other Topics Concern  . Not on file  Social History Narrative  . Not on file   Social Determinants of Health   Financial Resource Strain:   . Difficulty of Paying Living Expenses: Not on file  Food Insecurity:   . Worried About Charity fundraiser in the Last Year: Not on file  . Ran Out of Food in the Last Year: Not on file  Transportation Needs:   . Lack of Transportation (Medical): Not on file  . Lack of Transportation (Non-Medical): Not on file  Physical Activity:   . Days of Exercise per Week: Not on file  . Minutes of Exercise per Session: Not on file  Stress:   . Feeling of Stress : Not on file  Social Connections:   . Frequency of Communication with Friends and Family: Not on file  . Frequency of Social Gatherings with Friends and Family: Not on file  . Attends Religious Services: Not on file  . Active Member of Clubs or Organizations: Not on file  . Attends Archivist Meetings: Not on file  . Marital Status: Not on file  Intimate Partner Violence:   . Fear of Current or Ex-Partner: Not on file  . Emotionally Abused: Not on file  . Physically Abused: Not on file  . Sexually Abused: Not on file     Current Outpatient Medications:  .  Biotin 10000 MCG TABS, Take by mouth., Disp: , Rfl:  .  BL CALCIUM-MAGNESIUM-ZINC PO, Take by mouth., Disp: , Rfl:  .  VITAMIN D, CHOLECALCIFEROL, PO, Take by mouth.,  Disp: , Rfl:  .  estradiol (ESTRACE) 2 MG tablet, Take 1 tablet (2 mg total) by mouth daily. (Patient not taking: Reported on 05/27/2019), Disp: 30 tablet, Rfl: 11 .  progesterone (PROMETRIUM) 100 MG capsule, Take 1 capsule (100 mg total) by mouth daily. (Patient not taking: Reported on 05/27/2019), Disp: 30 capsule, Rfl: 11  No Known Allergies   ROS  Constitutional: Negative for fever or weight change.  Respiratory: See HPI, denies shortness of breath Cardiovascular: Negative for chest pain or palpitations.  Gastrointestinal: Negative for abdominal pain, no bowel changes.  Musculoskeletal: Negative for gait problem or joint swelling.  Skin: Negative for rash.  Neurological: Negative for dizziness or headache.  No other specific complaints in a complete review of systems (except as listed in HPI above).  Objective  Vitals:   05/27/19 0743  BP: 118/78  Pulse: 77  Resp: 16  Temp: (!) 97.2 F (36.2 C)  TempSrc: Temporal  SpO2: 95%  Weight: 198 lb (89.8 kg)  Height: '5\' 3"'$  (1.6 m)    Body mass index is 35.07 kg/m.  Physical Exam  Constitutional: Patient appears well-developed and well-nourished. No distress.  HENT: Head: Normocephalic and atraumatic. Ears: B TMs ok, no erythema or effusion; Nose: Nose normal. Mouth/Throat: Oropharynx is clear and moist. No oropharyngeal exudate.  Eyes: Conjunctivae and EOM are normal. Pupils are equal, round, and reactive to light. No scleral icterus.  Neck: Normal range of motion. Neck supple. No JVD present. No thyromegaly present.  Cardiovascular: Normal rate, regular rhythm and normal heart sounds.  No murmur heard. No BLE edema. Pulmonary/Chest: Effort normal and breath sounds normal. No respiratory distress. Abdominal: Soft. Bowel sounds are normal, no distension. There is no  tenderness. no masses Breast: no lumps or masses, no nipple discharge or rashes FEMALE GENITALIA: Deferred Musculoskeletal: Normal range of motion, no joint  effusions. No gross deformities Neurological: he is alert and oriented to person, place, and time. No cranial nerve deficit. Coordination, balance, strength, speech and gait are normal.  Skin: Skin is warm and dry. No rash noted. No erythema.  Psychiatric: Patient has a normal mood and affect. behavior is normal. Judgment and thought content normal.   No results found for this or any previous visit (from the past 2160 hour(s)).   Fall Risk: Fall Risk  05/27/2019 11/28/2018 05/20/2018 05/16/2017 01/10/2016  Falls in the past year? 0 0 1 No Yes  Number falls in past yr: 0 0 1 - 2 or more  Injury with Fall? 0 0 0 - No  Follow up Falls evaluation completed Falls evaluation completed - - -    Functional Status Survey: Is the patient deaf or have difficulty hearing?: No Does the patient have difficulty seeing, even when wearing glasses/contacts?: No Does the patient have difficulty concentrating, remembering, or making decisions?: No Does the patient have difficulty walking or climbing stairs?: No Does the patient have difficulty dressing or bathing?: No Does the patient have difficulty doing errands alone such as visiting a doctor's office or shopping?: No   Assessment & Plan  1. Well woman exam -USPSTF grade A and B recommendations reviewed with patient; age-appropriate recommendations, preventive care, screening tests, etc discussed and encouraged; healthy living encouraged; see AVS for patient education given to patient -Discussed importance of 150 minutes of physical activity weekly, eat two servings of fish weekly, eat one serving of tree nuts ( cashews, pistachios, pecans, almonds.Marland Kitchen) every other day, eat 6 servings of fruit/vegetables daily and drink plenty of water and avoid sweet beverages. - Lipid panel - B12 and Folate Panel - COMPLETE METABOLIC PANEL WITH GFR - VITAMIN D 25 Hydroxy (Vit-D Deficiency, Fractures) - Thyroid Panel With TSH - DG Bone Density; Future  2.  Hypercholesteremia - Lipid panel  3. Vitamin D deficiency - VITAMIN D 25 Hydroxy (Vit-D Deficiency, Fractures) - DG Bone Density; Future  4. Family history of thyroid disease - Thyroid Panel With TSH  5. Fatigue, unspecified type - B12 and Folate Panel - COMPLETE METABOLIC PANEL WITH GFR - VITAMIN D 25 Hydroxy (Vit-D Deficiency, Fractures) - Thyroid Panel With TSH  6. Family history of osteoporosis - DG Bone Density; Future  7. Osteoporosis screening - DG Bone Density; Future

## 2019-06-06 ENCOUNTER — Other Ambulatory Visit: Payer: Self-pay

## 2019-06-06 ENCOUNTER — Other Ambulatory Visit: Payer: Managed Care, Other (non HMO)

## 2019-06-06 DIAGNOSIS — E7800 Pure hypercholesterolemia, unspecified: Secondary | ICD-10-CM | POA: Diagnosis not present

## 2019-06-06 DIAGNOSIS — E559 Vitamin D deficiency, unspecified: Secondary | ICD-10-CM

## 2019-06-06 DIAGNOSIS — Z0000 Encounter for general adult medical examination without abnormal findings: Secondary | ICD-10-CM

## 2019-06-06 DIAGNOSIS — Z8349 Family history of other endocrine, nutritional and metabolic diseases: Secondary | ICD-10-CM

## 2019-06-07 LAB — COMPREHENSIVE METABOLIC PANEL
ALT: 24 IU/L (ref 0–32)
AST: 20 IU/L (ref 0–40)
Albumin/Globulin Ratio: 1.5 (ref 1.2–2.2)
Albumin: 4.3 g/dL (ref 3.8–4.9)
Alkaline Phosphatase: 53 IU/L (ref 39–117)
BUN/Creatinine Ratio: 9 (ref 9–23)
BUN: 9 mg/dL (ref 6–24)
Bilirubin Total: 0.3 mg/dL (ref 0.0–1.2)
CO2: 25 mmol/L (ref 20–29)
Calcium: 9.5 mg/dL (ref 8.7–10.2)
Chloride: 104 mmol/L (ref 96–106)
Creatinine, Ser: 1.01 mg/dL — ABNORMAL HIGH (ref 0.57–1.00)
GFR calc Af Amer: 72 mL/min/{1.73_m2} (ref >59)
GFR calc non Af Amer: 63 mL/min/{1.73_m2} (ref >59)
Globulin, Total: 2.8 g/dL (ref 1.5–4.5)
Glucose: 100 mg/dL — ABNORMAL HIGH (ref 65–99)
Potassium: 4.8 mmol/L (ref 3.5–5.2)
Sodium: 143 mmol/L (ref 134–144)
Total Protein: 7.1 g/dL (ref 6.0–8.5)

## 2019-06-07 LAB — LIPID PANEL
Chol/HDL Ratio: 2.8 ratio (ref 0.0–4.4)
Cholesterol, Total: 183 mg/dL (ref 100–199)
HDL: 66 mg/dL (ref >39)
LDL Chol Calc (NIH): 101 mg/dL — ABNORMAL HIGH (ref 0–99)
Triglycerides: 85 mg/dL (ref 0–149)
VLDL Cholesterol Cal: 16 mg/dL (ref 5–40)

## 2019-06-07 LAB — B12 AND FOLATE PANEL
Folate: 12.5 ng/mL (ref >3.0)
Vitamin B-12: 405 pg/mL (ref 232–1245)

## 2019-06-07 LAB — THYROID PANEL WITH TSH
Free Thyroxine Index: 2.4 (ref 1.2–4.9)
T3 Uptake Ratio: 28 % (ref 24–39)
T4, Total: 8.5 ug/dL (ref 4.5–12.0)
TSH: 1.76 u[IU]/mL (ref 0.450–4.500)

## 2019-06-07 LAB — VITAMIN D 25 HYDROXY (VIT D DEFICIENCY, FRACTURES): Vit D, 25-Hydroxy: 42.1 ng/mL (ref 30.0–100.0)

## 2019-06-23 NOTE — Progress Notes (Signed)
Name: Stephanie Pineda   MRN: [id number]    DOB: 1965-12-04   Date:06/24/2019       Progress Note  Subjective  Chief Complaint  Chief Complaint  Patient presents with  . Review Lab Work    I connected with  Carolanne Grumbling  on 06/24/19 at  7:40 AM EST by a video enabled telemedicine application and verified that I am speaking with the correct person using two identifiers.  I discussed the limitations of evaluation and management by telemedicine and the availability of in person appointments. The patient expressed understanding and agreed to proceed. Staff also discussed with the patient that there may be a patient responsible charge related to this service. Patient Location: Home Provider Location: [facility] Additional Individuals present: none  HPI Patient is a 54 y.o.female who follows up today.  She saw Raelyn Ensign for an annual well woman exam at the end of January 2021 with labs obtained in early Feb after and reviewed with patient by Raquel Sarna and again today. B12 levels normal, CMP is normal with glc 100, creatine 1.01, both just out of normal range noted and continue to monitor,Thyroid panel normal, Vitamin D looks great (current supplementation she noted is 50,000 units, and I rec'ed decreasing that to about 5,000 IU's daily presently for maintenance), Cholesterol - LDL is up just a bit, but triglycerides and HDL look great. No need for medication as summed up in communication to patient from [place]. All in all, labs are good.  She was seen in July 2020 for c/o fatigue, some muscle aches and joint pain, a few episodes of palpitations in prior months without CP/SOB and  with + thyroid disease in her family also noted.  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  She notes now she is more active, back going to the gym, and feeling better.  No palpitations, chest pains, shortness of breath.  No concerning  symptoms when she is more active with her physical activity.  Started on estradiol in May 2020 with her GYN for hot flashes, not taking presently. H/o elevated BP noted in past, BP good on recent readings. No outside checks.  She noted blood pressure has been well controlled, and only one time was a little high on a prior visit. BP Readings from Last 3 Encounters:  05/27/19 118/78  11/28/18 118/74  06/28/18 118/82   H/o Vit D def and on a supplement with last check good as above. Bone density ordered by Raquel Sarna after Jan visit.  She was told she needed to confirm with insurance that they would cover the bone density, and they noted that as long as there is a family history documented, it should be covered.  I did confirm that we noted her mom has osteoporosis.  Do feel pursuing a bone density would be helpful.   Tob - prior smoker  Alcohol use - rare Activity level - was away from gym for 6 months with pandemic, back recent past Mild obesity noted in past and recent weights fairly stable (up about 8 lbs from a year ago) Wt Readings from Last 3 Encounters:  06/24/19 195 lb (88.5 kg)  05/27/19 198 lb (89.8 kg)  11/28/18 198 lb 1.6 oz (89.9 kg)     Patient Active Problem List   Diagnosis Date Noted  . Obesity (BMI 30.0-34.9) 05/20/2018  . Vitamin D deficiency 05/16/2017  .  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 Screening for cervical cancer 01/10/2016  . Special screening for malignant neoplasms, colon   . Colon cancer screening 01/05/2015  . Elevated blood pressure 01/05/2015  . Breast cancer screening 01/05/2015  . Preventative health care 01/05/2015  . Hypercholesteremia     Past Surgical History:  Procedure Laterality Date  . COLONOSCOPY WITH PROPOFOL N/A 02/18/2015   Procedure: COLONOSCOPY WITH PROPOFOL;  Surgeon: Lucilla Lame, MD;  Location: [place];  Service: Endoscopy;  Laterality: N/A;  . ENDOMETRIAL ABLATION  2009   laser surgery for Dysplasia  .  ESOPHAGOGASTRODUODENOSCOPY    . KNEE SURGERY    . TUBAL LIGATION      Family History  Problem Relation Age of Onset  . Asthma Mother   . Hyperlipidemia Mother   . Thyroid disease Mother   . COPD Mother   . Osteoporosis Mother   . Heart disease Mother   . Hyperlipidemia Father   . Heart disease Father   . Hypertension Father   . COPD Father   . Glaucoma Father   . Cancer Sister        melanoma  . Diabetes Brother   . Heart disease Brother   . Hypertension Brother   . Thyroid disease Daughter   . Diabetes Maternal Grandmother   . Lymphoma Brother     Social History   Socioeconomic History  . Marital status: Married    Spouse name: Lanny Hurst  . Number of children: 1  . Years of education: 42  . Highest education level: Some college, no degree  Occupational History  . Not on file  Tobacco Use  . Smoking status: Former Smoker    Packs/day: 1.00    Years: 20.00    Pack years: 20.00    Types: Cigarettes    Quit date: 05/01/2006    Years since quitting: 13.1  . Smokeless tobacco: Never Used  Substance and Sexual Activity  . Alcohol use: Yes    Comment: 2 a month  . Drug use: No  . Sexual activity: Not Currently    Birth control/protection: Surgical    Comment: Ablation   Other Topics Concern  . Not on file  Social History Narrative  . Not on file   Social Determinants of Health   Financial Resource Strain:   . Difficulty of Paying Living Expenses: Not on file  Food Insecurity:   . Worried About Charity fundraiser in the Last Year: Not on file  . Ran Out of Food in the Last Year: Not on file  Transportation Needs:   . Lack of Transportation (Medical): Not on file  . Lack of Transportation (Non-Medical): Not on file  Physical Activity:   . Days of Exercise per Week: Not on file  . Minutes of Exercise per Session: Not on file  Stress:   . Feeling of Stress : Not on file  Social Connections:   . Frequency of Communication with Friends and Family: Not on file    . Frequency of Social Gatherings with Friends and Family: Not on file  . Attends Religious Services: Not on file  . Active Member of Clubs or Organizations: Not on file  . Attends Archivist Meetings: Not on file  . Marital Status: Not on file  Intimate Partner Violence:   . Fear of Current or Ex-Partner: Not on file  . Emotionally Abused: Not on file  . Physically Abused: Not on file  . Sexually Abused: Not  on file     Current Outpatient Medications:  .  Biotin 10000 MCG TABS, Take by mouth., Disp: , Rfl:  .  BL CALCIUM-MAGNESIUM-ZINC PO, Take by mouth., Disp: , Rfl:  .  VITAMIN D, CHOLECALCIFEROL, PO, Take by mouth., Disp: , Rfl:  .  estradiol (ESTRACE) 2 MG tablet, Take 1 tablet (2 mg total) by mouth daily. (Patient not taking: Reported on 05/27/2019), Disp: 30 tablet, Rfl: 11 .  progesterone (PROMETRIUM) 100 MG capsule, Take 1 capsule (100 mg total) by mouth daily. (Patient not taking: Reported on 05/27/2019), Disp: 30 capsule, Rfl: 11  No Known Allergies  With staff assistance, above reviewed with the patient today.   ROS: As per HPI, did note some insomnia, tried melatonin and made her heart race and not want to try again, not interested in other meds to help presently, otherwise no specific complaints on a limited and focused system review   Objective  Virtual encounter, vitals not obtained.  Body mass index is 34.54 kg/m.  Physical Exam  Patient appears in NAD No respiratory distress. Speaking in complete sentences Neurological: Pt is alert and oriented,  Speech is normal.  Psychiatric: Patient has a normal mood and affect, behavior is normal. Very appropriate with conversation, judgment and thought content normal.   No results found for this or any previous visit (from the past 72 hour(s)).  PHQ2/9: Depression screen [facility] 2/9 06/24/2019 05/27/2019 11/28/2018 05/20/2018 05/16/2017  Decreased Interest 1 0 3 0 0  Down, Depressed, Hopeless 0 0 0 0 0  PHQ - 2  Score 1 0 3 0 0  Altered sleeping 3 1 0 0 -  Tired, decreased energy 1 1 1  0 -  Change in appetite 1 0 0 0 -  Feeling bad or failure about yourself  0 1 1 0 -  Trouble concentrating 0 0 1 0 -  Moving slowly or fidgety/restless 0 0 1 0 -  Suicidal thoughts 0 0 0 0 -  PHQ-9 Score 6 3 7  0 -  Difficult doing work/chores Not difficult at all Not difficult at all Somewhat difficult Not difficult at all -   PHQ-2/9 Result reviewed, neg for major depression, c/w some insomnia noted as contributing to sx's  Fall Risk: Fall Risk  06/24/2019 05/27/2019 11/28/2018 05/20/2018 05/16/2017  Falls in the past year? 0 0 0 1 No  Number falls in past yr: 0 0 0 1 -  Injury with Fall? 0 0 0 0 -  Follow up - Falls evaluation completed Falls evaluation completed - -     Assessment & Plan  1. Vitamin D deficiency Most recent check was in the normal ranges, and do feel decreasing her dosage from 50,000 units daily appropriate at this time.  Recommended going to 5000 units daily, and will recheck again on follow-up. Do feel obtaining the bone density will also be helpful.  2. Obesity (BMI 30.0-34.9) Weights have been stable, and she again is more active at the gym and encouraged continued physical activity and the importance of that for weight maintenance.  Also helping with her diet modifications.  3. Hypercholesteremia Lipid panel was reviewed, LDL just slightly above desired range, and educated as well as included some recommendations in the AVS which she can obtain online to help.  4. Other insomnia As above, she had tried melatonin with side effect concerns, and was not anxious to try any other more stronger therapeutic medications to help with sleep.  Hopefully as she gets more  active, and the pandemic issues settle, this may improve and we will continue to monitor.  She asked the family had completed the form to get to her insurance company as that is needed so her costs are lower and I could not confirm  that today,did ask if she could ensure that it made to her insurance company, and if not to let us know.  We will tentatively schedule follow-up in about 6 months time, can follow-up sooner as needed.  I discussed the assessment and treatment plan with the patient. The patient was provided an opportunity to ask questions and all were answered. The patient agreed with the plan and demonstrated an understanding of the instructions.  I provided 20 minutes of non-face-to-face time during this encounter that included discussing at length patient's sx/history, pertinent pmhx, medications, treatment and follow up plan. This time also included the necessary documentation, orders, and chart review.

## 2019-06-24 ENCOUNTER — Ambulatory Visit (INDEPENDENT_AMBULATORY_CARE_PROVIDER_SITE_OTHER): Payer: Managed Care, Other (non HMO) | Admitting: Internal Medicine

## 2019-06-24 ENCOUNTER — Encounter: Payer: Self-pay | Admitting: Internal Medicine

## 2019-06-24 VITALS — HR 97 | Ht 63.0 in | Wt 195.0 lb

## 2019-06-24 DIAGNOSIS — E669 Obesity, unspecified: Secondary | ICD-10-CM | POA: Diagnosis not present

## 2019-06-24 DIAGNOSIS — E559 Vitamin D deficiency, unspecified: Secondary | ICD-10-CM

## 2019-06-24 DIAGNOSIS — E7800 Pure hypercholesterolemia, unspecified: Secondary | ICD-10-CM | POA: Diagnosis not present

## 2019-06-24 DIAGNOSIS — G4709 Other insomnia: Secondary | ICD-10-CM | POA: Diagnosis not present

## 2019-06-24 NOTE — Patient Instructions (Signed)
Your LDL is just slightly above the desired range. The LDL is the "lousy" or bad cholesterol. Over time and in combination with inflammation and other factors, this contributes to plaque which in turn may lead to stroke and/or heart attack down the road.  Sometimes high LDL is primarily genetic, and people might be eating all the right foods but still have high numbers.  Other times, there is room for improvement in one's diet and eating healthier can bring this number down and potentially reduce one's risk of heart attack and/or stroke.  Your LDL level should be below 100. If you have diabetes or a possible heart problem, your LDL should be below 70.  Some strategies to focus on to help improve your LDL levels:  - Eat 20 to 30 grams of fiber every day.  - Eat Foods such as fruits and vegetables, whole grains, beans, peas, nuts, and seeds can help lower LDL. - Avoid Saturated fats - Dairy foods - such as butter, cream, ghee, regular-fat milk and cheese. Meat - such as fatty cuts of beef, pork and lamb, processed meats like salami, sausages and the skin on chicken. Lard., fatty snack foods, cakes, biscuits, pies and deep fried foods) - Avoid smoking

## 2019-07-30 ENCOUNTER — Encounter: Payer: Self-pay | Admitting: Family Medicine

## 2019-07-31 NOTE — Telephone Encounter (Signed)
Pt declined an appt

## 2020-06-16 ENCOUNTER — Encounter: Payer: Self-pay | Admitting: Family Medicine

## 2020-06-16 ENCOUNTER — Other Ambulatory Visit: Payer: Self-pay | Admitting: Family Medicine

## 2020-06-16 ENCOUNTER — Other Ambulatory Visit: Payer: Self-pay

## 2020-06-16 ENCOUNTER — Ambulatory Visit (INDEPENDENT_AMBULATORY_CARE_PROVIDER_SITE_OTHER): Payer: Managed Care, Other (non HMO) | Admitting: Family Medicine

## 2020-06-16 VITALS — BP 120/90 | HR 74 | Temp 97.3°F | Resp 16 | Ht 63.0 in | Wt 197.2 lb

## 2020-06-16 DIAGNOSIS — Z1231 Encounter for screening mammogram for malignant neoplasm of breast: Secondary | ICD-10-CM | POA: Diagnosis not present

## 2020-06-16 DIAGNOSIS — E7800 Pure hypercholesterolemia, unspecified: Secondary | ICD-10-CM | POA: Diagnosis not present

## 2020-06-16 DIAGNOSIS — R072 Precordial pain: Secondary | ICD-10-CM | POA: Diagnosis not present

## 2020-06-16 DIAGNOSIS — E669 Obesity, unspecified: Secondary | ICD-10-CM

## 2020-06-16 DIAGNOSIS — R0789 Other chest pain: Secondary | ICD-10-CM

## 2020-06-16 NOTE — Patient Instructions (Signed)
It was great to see you!  Our plans for today:  - When you call to schedule your mammogram, ask about insurance coverage for your bone density study. - We are checking some labs today, we will release these results to your MyChart. - You can try ibuprofen as needed for your chest pain. If your work stress becomes unmanageable or if you notice your chest pain is worsening or changing, let us know.   Take care and seek immediate care sooner if you develop any concerns.   Dr. Ky Barban  Things to do to keep yourself healthy  - Exercise at least 30-45 minutes a day, 3-4 days a week.  - Eat a low-fat diet with lots of fruits and vegetables, up to 7-9 servings per day.  - Seatbelts can save your life. Wear them always.  - Smoke detectors on every level of your home, check batteries every year.  - Eye Doctor - have an eye exam every 1-2 years  - Safe sex - if you may be exposed to STDs, use a condom.  - Alcohol -  If you drink, do it moderately, less than 2 drinks per day.  - [place]. Choose someone to speak for you if you are not able. https://www.prepareforyourcare.org is a great website to help you navigate this. - Depression is common in our stressful world.If you're feeling down or losing interest in things you normally enjoy, please come in for a visit.  - Violence - If anyone is threatening or hurting you, please call immediately.

## 2020-06-16 NOTE — Progress Notes (Signed)
BP 120/90   Pulse 74   Temp (!) 97.3 F (36.3 C) (Oral)   Resp 16   Ht 5\' 3"  (1.6 m)   Wt 197 lb 3.2 oz (89.4 kg)   SpO2 99%   BMI 34.93 kg/m    Subjective:    Patient ID: Stephanie Pineda, female    DOB: 1965/11/28, 55 y.o.   MRN: [id number]  HPI: Stephanie Pineda is a 55 y.o. female presenting on 06/16/2020 for comprehensive medical examination. Current medical complaints include:  CHEST PAIN Time since onset: Duration:1 week Onset: sudden Quality: pressure-like Severity: mild Location: substernal Radiation: none Episode duration: few seconds to few minutes Frequency: intermittent Related to exertion: no Activity when pain started:  Trauma: no Anxiety/recent stressors: yes, work, new job Aggravating factors: none Alleviating factors: none Treatments attempted: nothing  Current pain status: in pain Shortness of breath: no Cough: no Nausea: no Diaphoresis: no Heartburn: sometimes Palpitations: no Doesn't happen at particular time of day. Pain increases with exhale. Dad with CHF, CABG. Brother with heart attack at 37 with stents.  Former smoker, 21 pack year history. Quit for 14 years. No leg swelling.   She currently lives with: husband Menopausal Symptoms: no  Depression Screen done today and results listed below:  Depression screen [facility] 2/9 06/16/2020 06/24/2019 05/27/2019 11/28/2018 05/20/2018  Decreased Interest 0 1 0 3 0  Down, Depressed, Hopeless 0 0 0 0 0  PHQ - 2 Score 0 1 0 3 0  Altered sleeping - 3 1 0 0  Tired, decreased energy - 1 1 1  0  Change in appetite - 1 0 0 0  Feeling bad or failure about yourself  - 0 1 1 0  Trouble concentrating - 0 0 1 0  Moving slowly or fidgety/restless - 0 0 1 0  Suicidal thoughts - 0 0 0 0  PHQ-9 Score - 6 3 7  0  Difficult doing work/chores - Not difficult at all Not difficult at all Somewhat difficult Not difficult at all    The patient does not have a history of falls. I did not complete a risk assessment for falls. A  plan of care for falls was not documented.   Past Medical History:  Past Medical History:  Diagnosis Date  . Abnormal Pap smear of cervix Dysplasia  . Arthritis    right hip, left knee  . Bruised    Pelvic bone - right side  . Family history of adverse reaction to anesthesia    Brother "flat-lined" during appendectomy > 20 yrs ago. Has had stents placed since without issues.  Marland Kitchen Heart murmur    followed by PCP  . History of pneumonia 1983  . Hypercholesteremia   . Obesity     Surgical History:  Past Surgical History:  Procedure Laterality Date  . COLONOSCOPY WITH PROPOFOL N/A 02/18/2015   Procedure: COLONOSCOPY WITH PROPOFOL;  Surgeon: Midge Minium, MD;  Location: [facility];  Service: Endoscopy;  Laterality: N/A;  . ENDOMETRIAL ABLATION  2009   laser surgery for Dysplasia  . ESOPHAGOGASTRODUODENOSCOPY    . KNEE SURGERY    . TUBAL LIGATION      Medications:  Current Outpatient Medications on File Prior to Visit  Medication Sig  . Biotin 40981 MCG TABS Take by mouth.  . BL CALCIUM-MAGNESIUM-ZINC PO Take by mouth.  Marland Kitchen VITAMIN D, CHOLECALCIFEROL, PO Take by mouth.   No current facility-administered medications on file prior to visit.    Allergies:  No Known Allergies  Social History:  Social History   Socioeconomic History  . Marital status: Married    Spouse name: Mellody Dance  . Number of children: 1  . Years of education: 25  . Highest education level: Some college, no degree  Occupational History  . Not on file  Tobacco Use  . Smoking status: Former Smoker    Packs/day: 1.00    Years: 20.00    Pack years: 20.00    Types: Cigarettes    Quit date: 05/01/2006    Years since quitting: 14.1  . Smokeless tobacco: Never Used  Vaping Use  . Vaping Use: Never used  Substance and Sexual Activity  . Alcohol use: Yes    Comment: 2 a month  . Drug use: No  . Sexual activity: Not Currently    Birth control/protection: Surgical    Comment: Ablation   Other  Topics Concern  . Not on file  Social History Narrative  . Not on file   Social Determinants of Health   Financial Resource Strain: Not on file  Food Insecurity: No Food Insecurity  . Worried About Programme researcher, broadcasting/film/video in the Last Year: Never true  . Ran Out of Food in the Last Year: Never true  Transportation Needs: No Transportation Needs  . Lack of Transportation (Medical): No  . Lack of Transportation (Non-Medical): No  Physical Activity: Sufficiently Active  . Days of Exercise per Week: 6 days  . Minutes of Exercise per Session: 30 min  Stress: No Stress Concern Present  . Feeling of Stress : Only a little  Social Connections: Moderately Integrated  . Frequency of Communication with Friends and Family: Three times a week  . Frequency of Social Gatherings with Friends and Family: Patient refused  . Attends Religious Services: More than 4 times per year  . Active Member of Clubs or Organizations: No  . Attends Banker Meetings: Never  . Marital Status: Married  Catering manager Violence: Not At Risk  . Fear of Current or Ex-Partner: No  . Emotionally Abused: No  . Physically Abused: No  . Sexually Abused: No   Social History   Tobacco Use  Smoking Status Former Smoker  . Packs/day: 1.00  . Years: 20.00  . Pack years: 20.00  . Types: Cigarettes  . Quit date: 05/01/2006  . Years since quitting: 14.1  Smokeless Tobacco Never Used   Social History   Substance and Sexual Activity  Alcohol Use Yes   Comment: 2 a month    Family History:  Family History  Problem Relation Age of Onset  . Asthma Mother   . Hyperlipidemia Mother   . Thyroid disease Mother   . COPD Mother   . Osteoporosis Mother   . Heart disease Mother   . Hyperlipidemia Father   . Heart disease Father   . Hypertension Father   . COPD Father   . Glaucoma Father   . Cancer Sister        melanoma  . Diabetes Brother   . Heart disease Brother   . Hypertension Brother   . Thyroid  disease Daughter   . Diabetes Maternal Grandmother   . Lymphoma Brother     Past medical history, surgical history, medications, allergies, family history and social history reviewed with patient today and changes made to appropriate areas of the chart.   ROS All other ROS negative except what is listed above and in the HPI.  Objective:    BP 120/90   Pulse 74   Temp (!) 97.3 F (36.3 C) (Oral)   Resp 16   Ht 5\' 3"  (1.6 m)   Wt 197 lb 3.2 oz (89.4 kg)   SpO2 99%   BMI 34.93 kg/m   Wt Readings from Last 3 Encounters:  06/16/20 197 lb 3.2 oz (89.4 kg)  06/24/19 195 lb (88.5 kg)  05/27/19 198 lb (89.8 kg)    Physical Exam Constitutional:      General: She is not in acute distress.    Appearance: Normal appearance. She is not ill-appearing, toxic-appearing or diaphoretic.  HENT:     Head: Normocephalic.     Right Ear: External ear normal.     Left Ear: External ear normal.     Nose: Nose normal. No congestion or rhinorrhea.     Mouth/Throat:     Mouth: Mucous membranes are moist.  Eyes:     Extraocular Movements: Extraocular movements intact.     Pupils: Pupils are equal, round, and reactive to light.  Cardiovascular:     Rate and Rhythm: Normal rate and regular rhythm.     Heart sounds: Normal heart sounds. No murmur heard.   Pulmonary:     Effort: Pulmonary effort is normal. No respiratory distress.     Breath sounds: Normal breath sounds.  Abdominal:     General: Bowel sounds are normal.     Palpations: Abdomen is soft. There is no mass.     Tenderness: There is no abdominal tenderness. There is no guarding.  Musculoskeletal:        General: Normal range of motion.     Cervical back: Normal range of motion.     Right lower leg: No edema.     Left lower leg: No edema.  Skin:    General: Skin is warm.  Neurological:     Mental Status: She is alert and oriented to person, place, and time. Mental status is at baseline.  Psychiatric:        Mood and  Affect: Mood normal.        Behavior: Behavior normal.     Results for orders placed or performed in visit on 06/06/19  Lipid panel  Result Value Ref Range   Cholesterol, Total 183 100 - 199 mg/dL   Triglycerides 85 0 - 149 mg/dL   HDL 66 >69 mg/dL   VLDL Cholesterol Cal 16 5 - 40 mg/dL   LDL Chol Calc (NIH) 629 (H) 0 - 99 mg/dL   Chol/HDL Ratio 2.8 0.0 - 4.4 ratio  B12 and Folate Panel  Result Value Ref Range   Vitamin B-12 405 232 - 1,245 pg/mL   Folate 12.5 >3.0 ng/mL  Comprehensive metabolic panel  Result Value Ref Range   Glucose 100 (H) 65 - 99 mg/dL   BUN 9 6 - 24 mg/dL   Creatinine, Ser 5.28 (H) 0.57 - 1.00 mg/dL   GFR calc non Af Amer 63 >59 mL/min/1.73   GFR calc Af Amer 72 >59 mL/min/1.73   BUN/Creatinine Ratio 9 9 - 23   Sodium 143 134 - 144 mmol/L   Potassium 4.8 3.5 - 5.2 mmol/L   Chloride 104 96 - 106 mmol/L   CO2 25 20 - 29 mmol/L   Calcium 9.5 8.7 - 10.2 mg/dL   Total Protein 7.1 6.0 - 8.5 g/dL   Albumin 4.3 3.8 - 4.9 g/dL   Globulin, Total 2.8 1.5 - 4.5 g/dL  Albumin/Globulin Ratio 1.5 1.2 - 2.2   Bilirubin Total 0.3 0.0 - 1.2 mg/dL   Alkaline Phosphatase 53 39 - 117 IU/L   AST 20 0 - 40 IU/L   ALT 24 0 - 32 IU/L  VITAMIN D 25 Hydroxy (Vit-D Deficiency, Fractures)  Result Value Ref Range   Vit D, 25-Hydroxy 42.1 30.0 - 100.0 ng/mL  Thyroid Panel With TSH  Result Value Ref Range   TSH 1.760 0.450 - 4.500 uIU/mL   T4, Total 8.5 4.5 - 12.0 ug/dL   T3 Uptake Ratio 28 24 - 39 %   Free Thyroxine Index 2.4 1.2 - 4.9      Assessment & Plan:   Problem List Items Addressed This Visit      Other   Hypercholesteremia    Rechecking labs today.       Relevant Orders   Lipid panel   Breast cancer screening - Primary   Relevant Orders   MM 3D SCREEN BREAST BILATERAL   Obesity (BMI 30.0-34.9)    Recommend weight loss through diet and exercise.      Relevant Orders   Basic Metabolic Panel (BMET)   Lipid panel   Chest pain    Atypical in nature,  lasting few seconds, self-resolving and occurring with breathing. Had resolved by physical exam, no chest wall tenderness. EKG wnl today. Does have some recent increase in stressors, may be contributing, however given extensive family history of early cardiac disease, HLD, obesity, former smoker, will refer to Cardiology for stress testing.**      Relevant Orders   EKG 12-Lead       Follow up plan: Return in about 1 year (around 06/16/2021) for cpe.   LABORATORY TESTING:  - Pap smear: up to date  IMMUNIZATIONS:   - Tdap: Tetanus vaccination status reviewed: last tetanus booster within 10 years. - Influenza: Refused - Pneumovax: Not applicable - Prevnar: Not applicable - HPV: Not applicable - Shingrix vaccine: due  SCREENING: -Mammogram: Ordered today  - Colonoscopy: Up to date  - Bone Density: ordered due to postmenopausal status, insurance doesn't cover  - Lung cancer screening: n/a  PATIENT COUNSELING:   Advised to take 1 mg of folate supplement per day if capable of pregnancy.   Sexuality: Discussed sexually transmitted diseases, partner selection, use of condoms, avoidance of unintended pregnancy  and contraceptive alternatives.   Advised to avoid cigarette smoking.  I discussed with the patient that most people either abstain from alcohol or drink within safe limits (<=14/week and <=4 drinks/occasion for males, <=7/weeks and <= 3 drinks/occasion for females) and that the risk for alcohol disorders and other health effects rises proportionally with the number of drinks per week and how often a drinker exceeds daily limits.  Discussed cessation/primary prevention of drug use and availability of treatment for abuse.   Diet: Encouraged to adjust caloric intake to maintain  or achieve ideal body weight, to reduce intake of dietary saturated fat and total fat, to limit sodium intake by avoiding high sodium foods and not adding table salt, and to maintain adequate dietary  potassium and calcium preferably from fresh fruits, vegetables, and low-fat dairy products.    stressed the importance of regular exercise - walking daily  Injury prevention: Discussed safety belts, safety helmets, smoke detector, smoking near bedding or upholstery.   Dental health: Discussed importance of regular tooth brushing, flossing, and dental visits.    NEXT PREVENTATIVE PHYSICAL DUE IN 1 YEAR. Return in about 1 year (around  06/16/2021) for cpe.

## 2020-06-17 DIAGNOSIS — R079 Chest pain, unspecified: Secondary | ICD-10-CM | POA: Insufficient documentation

## 2020-06-17 HISTORY — DX: Chest pain, unspecified: R07.9

## 2020-06-17 LAB — BASIC METABOLIC PANEL
BUN: 10 mg/dL (ref 7–25)
CO2: 27 mmol/L (ref 20–32)
Calcium: 10 mg/dL (ref 8.6–10.4)
Chloride: 103 mmol/L (ref 98–110)
Creat: 0.76 mg/dL (ref 0.50–1.05)
Glucose, Bld: 97 mg/dL (ref 65–99)
Potassium: 5.2 mmol/L (ref 3.5–5.3)
Sodium: 141 mmol/L (ref 135–146)

## 2020-06-17 LAB — LIPID PANEL
Cholesterol: 243 mg/dL — ABNORMAL HIGH (ref <200)
HDL: 69 mg/dL (ref >=50)
LDL Cholesterol (Calc): 151 mg/dL (calc) — ABNORMAL HIGH
Non-HDL Cholesterol (Calc): 174 mg/dL (calc) — ABNORMAL HIGH (ref <130)
Total CHOL/HDL Ratio: 3.5 (calc) (ref <5.0)
Triglycerides: 117 mg/dL (ref <150)

## 2020-06-17 NOTE — Assessment & Plan Note (Addendum)
Atypical in nature, lasting few seconds, self-resolving and occurring with breathing. Had resolved by physical exam, no chest wall tenderness. EKG wnl today. Does have some recent increase in stressors, may be contributing, however given extensive family history of early cardiac disease, HLD, obesity, former smoker, will refer to Cardiology for stress testing.**

## 2020-06-17 NOTE — Assessment & Plan Note (Signed)
Rechecking labs today.

## 2020-06-17 NOTE — Assessment & Plan Note (Signed)
Recommend weight loss through diet and exercise.

## 2020-06-22 ENCOUNTER — Encounter: Payer: Managed Care, Other (non HMO) | Admitting: Internal Medicine

## 2020-12-17 ENCOUNTER — Other Ambulatory Visit: Payer: Self-pay

## 2020-12-17 ENCOUNTER — Encounter: Payer: Self-pay | Admitting: Family Medicine

## 2020-12-17 ENCOUNTER — Ambulatory Visit: Payer: Managed Care, Other (non HMO) | Admitting: Family Medicine

## 2020-12-17 VITALS — BP 130/78 | HR 80 | Temp 98.1°F | Resp 16 | Ht 63.0 in | Wt 193.8 lb

## 2020-12-17 DIAGNOSIS — R1031 Right lower quadrant pain: Secondary | ICD-10-CM | POA: Diagnosis not present

## 2020-12-17 LAB — POCT URINALYSIS DIPSTICK
Bilirubin, UA: NEGATIVE
Blood, UA: NEGATIVE
Glucose, UA: NEGATIVE
Ketones, UA: NEGATIVE
Leukocytes, UA: NEGATIVE
Nitrite, UA: NEGATIVE
Odor: NORMAL
Protein, UA: NEGATIVE
Spec Grav, UA: 1.015 (ref 1.010–1.025)
Urobilinogen, UA: 0.2 E.U./dL
pH, UA: 6 (ref 5.0–8.0)

## 2020-12-17 NOTE — Progress Notes (Signed)
Patient ID: Stephanie Pineda, female    DOB: 06/06/1965, 55 y.o.   MRN: [id number]  PCP: Hubbard Hartshorn, FNP  Chief Complaint  Patient presents with   Abdominal Pain    RLQ    Subjective:   Stephanie Pineda is a 55 y.o. female, presents to clinic with CC of the following:  HPI  RLQ abd pain intermittent, started 6 weeks ago gradual onset, intermittent, comes and goes, is described as sharp and located to RLQ sometimes radiates around right hip/side, seems to be gradually worsening in frequency (now daily) and severity - severe enough that she could not sleep last night but not "stopping her in her tracks", lasts for short periods of time-minutes, no aggravating or alleviating factors, no change with positions, eating, before or after BM, time of day, movement/exercise. Denies associated BM change, urinary sx, vaginal sx, dysparenia, fever, chills, sweats, anorexia, change in appetite, bloating, indigestion, weight changes, blood in stool/melena She has only tried ibuprofen a few times - doesn't like meds, in pain currently while in exam room  Hx of 2009 endometrial oblation Prior to that in 1980's tubal ligation, explap and cervical procedures - states cervix was lazered no biopsy, no hx of CA, UTD on PAP Has appendix, gallbladder UTD on colon CA screening  Bowels normally a little constipated BM every couple days no change from this, occasionally uses stool softeners   Patient Active Problem List   Diagnosis Date Noted   Chest pain 06/17/2020   Obesity (BMI 30.0-34.9) 05/20/2018   Vitamin D deficiency 05/16/2017   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   Screening for cervical cancer 01/10/2016   Special screening for malignant neoplasms, colon    Colon cancer screening 01/05/2015   Elevated blood pressure 01/05/2015   Breast cancer screening 01/05/2015   Preventative health care 01/05/2015   Hypercholesteremia       Current Outpatient Medications:    Biotin 10000 MCG TABS,  Take by mouth., Disp: , Rfl:    BL CALCIUM-MAGNESIUM-ZINC PO, Take by mouth., Disp: , Rfl:    VITAMIN D, CHOLECALCIFEROL, PO, Take by mouth., Disp: , Rfl:    No Known Allergies   Social History   Tobacco Use   Smoking status: Former    Packs/day: 1.00    Years: 20.00    Pack years: 20.00    Types: Cigarettes    Quit date: 05/01/2006    Years since quitting: 14.6   Smokeless tobacco: Never  Vaping Use   Vaping Use: Never used  Substance Use Topics   Alcohol use: Yes    Comment: 2 a month   Drug use: No      Chart Review Today: I personally reviewed active problem list, medication list, allergies, family history, social history, health maintenance, notes from last encounter, lab results, imaging with the patient/caregiver today. Reviewed last labs, prior imaging, PAP, family and personal med hx  Review of Systems  Constitutional: Negative.   HENT: Negative.    Eyes: Negative.   Respiratory: Negative.    Cardiovascular: Negative.   Gastrointestinal: Negative.   Endocrine: Negative.   Genitourinary: Negative.   Musculoskeletal: Negative.   Skin: Negative.   Allergic/Immunologic: Negative.   Neurological: Negative.   Hematological: Negative.   Psychiatric/Behavioral: Negative.    All other systems reviewed and are negative.     Objective:   Vitals:   12/17/20 0915  BP: 130/78  Pulse: 80  Resp: 16  Temp: 98.1 F (36.7 C)  SpO2: 98%  Weight: 193 lb 12.8 oz (87.9 kg)  Height: '5\' 3"'$  (1.6 m)    Body mass index is 34.33 kg/m.  Physical Exam Vitals and nursing note reviewed.  Constitutional:      General: She is not in acute distress.    Appearance: Normal appearance. She is well-developed. She is obese. She is not ill-appearing, toxic-appearing or diaphoretic.  HENT:     Head: Normocephalic and atraumatic.     Right Ear: External ear normal.     Left Ear: External ear normal.     Mouth/Throat:     Mouth: Mucous membranes are moist.  Eyes:     General:  No scleral icterus.       Right eye: No discharge.        Left eye: No discharge.  Cardiovascular:     Rate and Rhythm: Normal rate and regular rhythm.     Heart sounds: Normal heart sounds. No murmur heard.   No friction rub. No gallop.  Pulmonary:     Effort: Pulmonary effort is normal. No respiratory distress.     Breath sounds: Normal breath sounds. No wheezing or rales.  Abdominal:     General: Bowel sounds are normal. There is no distension or abdominal bruit.     Palpations: Abdomen is soft. There is no hepatomegaly, splenomegaly, mass or pulsatile mass.     Tenderness: There is abdominal tenderness (tender w/o guarding or rebound tenderness) in the right lower quadrant. There is no right CVA tenderness, left CVA tenderness, guarding or rebound. Negative signs include Murphy's sign, Rovsing's sign and psoas sign.     Hernia: No hernia is present.  Skin:    General: Skin is warm and dry.     Coloration: Skin is not jaundiced or pale.     Findings: No erythema or lesion.  Neurological:     Mental Status: She is alert. Mental status is at baseline.     Gait: Gait normal.  Psychiatric:        Mood and Affect: Mood normal.        Behavior: Behavior normal.     Results for orders placed or performed in visit on 12/17/20  POCT urinalysis dipstick  Result Value Ref Range   Color, UA yellow    Clarity, UA clear    Glucose, UA Negative Negative   Bilirubin, UA neg    Ketones, UA neg    Spec Grav, UA 1.015 1.010 - 1.025   Blood, UA neg    pH, UA 6.0 5.0 - 8.0   Protein, UA Negative Negative   Urobilinogen, UA 0.2 0.2 or 1.0 E.U./dL   Nitrite, UA neg    Leukocytes, UA Negative Negative   Appearance clear    Odor normal        Assessment & Plan:   1. RLQ abdominal pain Area of tenderness is concerning for possible appendix involvement but she had neg rosving, no rebound ttp, has not had fever, anorexia and pain has been ongoing for weeks intermittent and gradually  worsening  No CVA ttp or suprapubic ttp - UA dip unremarkable - doubt UTI, however urine culture added to r/o UTI.   Episodic nature of pain does keep nephrolithiasis in ddx Possible ovarian etiology? Could do pelvic US no vaginal sx, no pain with sex, low risk for PID, utd on cervical ca screening tests Offered stat CT Abd/pelvis today due to pain at time of exam and worsening pain, however she did  not feel it was necessary today and she had no guarding or rebound tenderness, was well appearing, and VSS we agreed it was appropriate to wait for basic labs and get some kind of imaging next week if pain continues to worsen We discussed signs and sx which warrant ER visit Labs done today: - POCT urinalysis dipstick - CBC with Differential/Platelet - COMPLETE METABOLIC PANEL WITH GFR - Urine Culture  Future possible imaging - CT vs pelvic US  Continue to manage with OTC meds prn    Delsa Grana, PA-C 12/17/20 9:34 AM

## 2020-12-18 LAB — CBC WITH DIFFERENTIAL/PLATELET
Absolute Monocytes: 446 cells/uL (ref 200–950)
Basophils Absolute: 18 cells/uL (ref 0–200)
Basophils Relative: 0.4 %
Eosinophils Absolute: 68 cells/uL (ref 15–500)
Eosinophils Relative: 1.5 %
HCT: 40 % (ref 35.0–45.0)
Hemoglobin: 13 g/dL (ref 11.7–15.5)
Lymphs Abs: 1724 cells/uL (ref 850–3900)
MCH: 29.2 pg (ref 27.0–33.0)
MCHC: 32.5 g/dL (ref 32.0–36.0)
MCV: 89.9 fL (ref 80.0–100.0)
MPV: 11.3 fL (ref 7.5–12.5)
Monocytes Relative: 9.9 %
Neutro Abs: 2246 cells/uL (ref 1500–7800)
Neutrophils Relative %: 49.9 %
Platelets: 234 10*3/uL (ref 140–400)
RBC: 4.45 10*6/uL (ref 3.80–5.10)
RDW: 13.5 % (ref 11.0–15.0)
Total Lymphocyte: 38.3 %
WBC: 4.5 10*3/uL (ref 3.8–10.8)

## 2020-12-18 LAB — COMPLETE METABOLIC PANEL WITH GFR
AG Ratio: 1.5 (calc) (ref 1.0–2.5)
ALT: 21 U/L (ref 6–29)
AST: 19 U/L (ref 10–35)
Albumin: 4.3 g/dL (ref 3.6–5.1)
Alkaline phosphatase (APISO): 55 U/L (ref 37–153)
BUN: 13 mg/dL (ref 7–25)
CO2: 28 mmol/L (ref 20–32)
Calcium: 9.8 mg/dL (ref 8.6–10.4)
Chloride: 104 mmol/L (ref 98–110)
Creat: 0.85 mg/dL (ref 0.50–1.03)
Globulin: 2.9 g/dL (calc) (ref 1.9–3.7)
Glucose, Bld: 95 mg/dL (ref 65–99)
Potassium: 4.5 mmol/L (ref 3.5–5.3)
Sodium: 140 mmol/L (ref 135–146)
Total Bilirubin: 0.4 mg/dL (ref 0.2–1.2)
Total Protein: 7.2 g/dL (ref 6.1–8.1)
eGFR: 80 mL/min/{1.73_m2} (ref >=60)

## 2020-12-18 LAB — URINE CULTURE
MICRO NUMBER:: 12266731
Result:: NO GROWTH
SPECIMEN QUALITY:: ADEQUATE

## 2020-12-20 NOTE — Addendum Note (Signed)
Addended by: Delsa Grana on: 12/20/2020 02:29 PM   Modules accepted: Orders

## 2020-12-27 ENCOUNTER — Encounter: Payer: Self-pay | Admitting: Family Medicine

## 2021-01-12 ENCOUNTER — Other Ambulatory Visit: Payer: Self-pay

## 2021-01-12 ENCOUNTER — Ambulatory Visit
Admission: RE | Admit: 2021-01-12 | Discharge: 2021-01-12 | Disposition: A | Discharge status: Home / Self Care | Payer: Managed Care, Other (non HMO) | Source: Ambulatory Visit | Attending: Family Medicine | Admitting: Family Medicine

## 2021-01-12 ENCOUNTER — Ambulatory Visit: Admission: RE | Admit: 2021-01-12 | Payer: Managed Care, Other (non HMO) | Source: Ambulatory Visit

## 2021-01-12 DIAGNOSIS — R1031 Right lower quadrant pain: Secondary | ICD-10-CM | POA: Insufficient documentation

## 2021-05-01 DIAGNOSIS — M51369 Other intervertebral disc degeneration, lumbar region without mention of lumbar back pain or lower extremity pain: Secondary | ICD-10-CM

## 2021-05-01 HISTORY — DX: Other intervertebral disc degeneration, lumbar region without mention of lumbar back pain or lower extremity pain: M51.369

## 2021-07-15 NOTE — Progress Notes (Signed)
Name: Stephanie Pineda   MRN: [id number]    DOB: 09/23/1965   Date:07/18/2021 ? ?     Progress Note ? ?Subjective ? ?Chief Complaint ? ?Annual Exam ? ?HPI ? ?Patient presents for annual CPE. ? ?Diet: she has been counting calories Dec 2022, initially was due to stress, but after that trying to lose weight. She wants to donate a kidney to her nephew that is in his 72's and needs a kidney transplant . She is happy with her progress, lost about 20 lbs  ?Exercise: continue regular physical activity   ? ?[place] [facility] Visit from 07/18/2021 in [facility]  ?AUDIT-C Score 2  ? ?  ? ?Depression: Phq 9 is  negative ?Depression screen [facility] 2/9 07/18/2021 12/17/2020 06/16/2020 06/24/2019 05/27/2019  ?Decreased Interest 0 0 0 1 0  ?Down, Depressed, Hopeless 0 0 0 0 0  ?PHQ - 2 Score 0 0 0 1 0  ?Altered sleeping 0 0 - 3 1  ?Tired, decreased energy 0 0 - 1 1  ?Change in appetite 0 0 - 1 0  ?Feeling bad or failure about yourself  0 0 - 0 1  ?Trouble concentrating 0 0 - 0 0  ?Moving slowly or fidgety/restless 0 0 - 0 0  ?Suicidal thoughts 0 0 - 0 0  ?PHQ-9 Score 0 0 - 6 3  ?Difficult doing work/chores - Not difficult at all - Not difficult at all Not difficult at all  ? ?Hypertension: ?BP Readings from Last 3 Encounters:  ?07/18/21 122/84  ?12/17/20 130/78  ?06/16/20 120/90  ? ?Obesity: ?Wt Readings from Last 3 Encounters:  ?07/18/21 173 lb (78.5 kg)  ?12/17/20 193 lb 12.8 oz (87.9 kg)  ?06/16/20 197 lb 3.2 oz (89.4 kg)  ? ?BMI Readings from Last 3 Encounters:  ?07/18/21 30.65 kg/m?  ?12/17/20 34.33 kg/m?  ?06/16/20 34.93 kg/m?  ?  ? ?Vaccines:  ? ?HPV: N/A ?Tdap: up to date ?Shingrix: discussed with patient  ?Pneumonia: N/A ?Flu: refused  ?COVID-19: just had one [place] , discussed booster  ? ? ?Hep C Screening: 08/07/16 ?STD testing and prevention (HIV/chl/gon/syphilis): 08/07/16 ?Intimate partner violence: negative ?Sexual History : not sexually active for a long time, husband has ED ?Menstrual  History/LMP/Abnormal Bleeding: she states had endometrial ablation around age 22 , but no symptoms since age 36  ?Discussed importance of follow up if any post-menopausal bleeding: yes ?Incontinence Symptoms: Yes.   She seldom has urinary urgency  ? ?Breast cancer:  ?- Last Mammogram: Ordered today ?- BRCA gene screening: N/A ? ?Osteoporosis Prevention : Discussed high calcium and vitamin D supplementation, weight bearing exercises ?Bone density :not applicable ? ?Cervical cancer screening: 05/20/18 ? ?Skin cancer: Discussed monitoring for atypical lesions  ?Colorectal cancer: 02/18/15   ?Lung cancer:  Low Dose CT Chest recommended if Age 22-80 years, 20 pack-year currently smoking OR have quit w/in 15years. Patient does not qualify.   ?ECG: 06/16/20 ? ?Advanced Care Planning: A voluntary discussion about advance care planning including the explanation and discussion of advance directives.  Discussed health care proxy and Living will, and the patient was able to identify a health care proxy as husband .  Patient does not have a living will or power of attorney of health care ? ?Lipids: ?Lab Results  ?Component Value Date  ? CHOL 243 (H) 06/16/2020  ? CHOL 183 06/06/2019  ? CHOL 231 (H) 05/20/2018  ? ?Lab Results  ?Component Value Date  ? HDL 69 06/16/2020  ?  HDL 66 06/06/2019  ? HDL 68 05/20/2018  ? ?Lab Results  ?Component Value Date  ? LDLCALC 151 (H) 06/16/2020  ? LDLCALC 101 (H) 06/06/2019  ? LDLCALC 143 (H) 05/20/2018  ? ?Lab Results  ?Component Value Date  ? TRIG 117 06/16/2020  ? TRIG 85 06/06/2019  ? TRIG 90 05/20/2018  ? ?Lab Results  ?Component Value Date  ? CHOLHDL 3.5 06/16/2020  ? CHOLHDL 2.8 06/06/2019  ? CHOLHDL 3.4 05/20/2018  ? ?No results found for: LDLDIRECT ? ?Glucose: ?Glucose  ?Date Value Ref Range Status  ?06/06/2019 100 (H) 65 - 99 mg/dL Final  ?11/28/2018 98 65 - 99 mg/dL Final  ? ?Glucose, Bld  ?Date Value Ref Range Status  ?12/17/2020 95 65 - 99 mg/dL Final  ?  Comment:  ?  . ?            Fasting reference interval ?. ?  ?06/16/2020 97 65 - 99 mg/dL Final  ?  Comment:  ?  . ?           Fasting reference interval ?. ?  ?05/20/2018 90 65 - 99 mg/dL Final  ?  Comment:  ?  . ?           Fasting reference interval ?. ?  ? ? ?Patient Active Problem List  ? Diagnosis Date Noted  ? Obesity (BMI 30.0-34.9) 05/20/2018  ? Vitamin D deficiency 05/16/2017  ? Hypercholesteremia   ? ? ?Past Surgical History:  ?Procedure Laterality Date  ? COLONOSCOPY WITH PROPOFOL N/A 02/18/2015  ? Procedure: COLONOSCOPY WITH PROPOFOL;  Surgeon: Lucilla Lame, MD;  Location: [place];  Service: Endoscopy;  Laterality: N/A;  ? ENDOMETRIAL ABLATION  2009  ? laser surgery for Dysplasia  ? ESOPHAGOGASTRODUODENOSCOPY    ? KNEE SURGERY    ? TUBAL LIGATION    ? ? ?Family History  ?Problem Relation Age of Onset  ? Asthma Mother   ? Hyperlipidemia Mother   ? Thyroid disease Mother   ? COPD Mother   ? Osteoporosis Mother   ? Heart disease Mother   ? Hyperlipidemia Father   ? Heart disease Father   ? Hypertension Father   ? COPD Father   ? Glaucoma Father   ? Cancer Sister   ?     melanoma  ? Diabetes Brother   ? Heart disease Brother   ? Hypertension Brother   ? Thyroid disease Daughter   ? Diabetes Maternal Grandmother   ? Lymphoma Brother   ? ? ?Social History  ? ?Socioeconomic History  ? Marital status: Married  ?  Spouse name: Lanny Hurst  ? Number of children: 1  ? Years of education: 75  ? Highest education level: Some college, no degree  ?Occupational History  ? Not on file  ?Tobacco Use  ? Smoking status: Former  ?  Packs/day: 1.00  ?  Years: 20.00  ?  Pack years: 20.00  ?  Types: Cigarettes  ?  Quit date: 05/01/2006  ?  Years since quitting: 15.2  ? Smokeless tobacco: Never  ?Vaping Use  ? Vaping Use: Never used  ?Substance and Sexual Activity  ? Alcohol use: Yes  ?  Comment: 2 a month  ? Drug use: No  ? Sexual activity: Not Currently  ?  Partners: Male  ?  Birth control/protection: Post-menopausal  ?Other Topics Concern  ? Not on  file  ?Social History Narrative  ? Not on file  ? ?Social Determinants of Health  ? ?  Financial Resource Strain: Low Risk   ? Difficulty of Paying Living Expenses: Not hard at all  ?Food Insecurity: No Food Insecurity  ? Worried About Charity fundraiser in the Last Year: Never true  ? Ran Out of Food in the Last Year: Never true  ?Transportation Needs: No Transportation Needs  ? Lack of Transportation (Medical): No  ? Lack of Transportation (Non-Medical): No  ?Physical Activity: Sufficiently Active  ? Days of Exercise per Week: 6 days  ? Minutes of Exercise per Session: 30 min  ?Stress: No Stress Concern Present  ? Feeling of Stress : Only a little  ?Social Connections: Moderately Integrated  ? Frequency of Communication with Friends and Family: Twice a week  ? Frequency of Social Gatherings with Friends and Family: Once a week  ? Attends Religious Services: More than 4 times per year  ? Active Member of Clubs or Organizations: No  ? Attends Archivist Meetings: Never  ? Marital Status: Married  ?Intimate Partner Violence: Not At Risk  ? Fear of Current or Ex-Partner: No  ? Emotionally Abused: No  ? Physically Abused: No  ? Sexually Abused: No  ? ? ? ?Current Outpatient Medications:  ?  Biotin 10000 MCG TABS, Take by mouth., Disp: , Rfl:  ?  BL CALCIUM-MAGNESIUM-ZINC PO, Take by mouth., Disp: , Rfl:  ?  VITAMIN D, CHOLECALCIFEROL, PO, Take by mouth., Disp: , Rfl:  ? ?No Known Allergies ? ? ?ROS ? ?Constitutional: Negative for fever, positive for  weight change.  ?Respiratory: Negative for cough and shortness of breath.   ?Cardiovascular: Negative for chest pain or palpitations.  ?Gastrointestinal: Negative for abdominal pain, no bowel changes.  ?Musculoskeletal: Negative for gait problem or joint swelling.  ?Skin: Negative for rash.  ?Neurological: Negative for dizziness or headache.  ?No other specific complaints in a complete review of systems (except as listed in HPI above).  ? ?Objective ? ?Vitals:  ?  07/18/21 0905  ?BP: 122/84  ?Pulse: 86  ?Resp: 16  ?Weight: 173 lb (78.5 kg)  ?Height: $RemoveB'5\' 3"'tMExMsQe$  (1.6 m)  ? ? ?Body mass index is 30.65 kg/m?. ? ?Physical Exam ? ?Constitutional: Patient appears well-developed and well-n

## 2021-07-15 NOTE — Patient Instructions (Addendum)
Check shingrix coverage with insurance ? ?Preventive Care 11-58 Years Old, Female ?Preventive care refers to lifestyle choices and visits with your health care provider that can promote health and wellness. Preventive care visits are also called wellness exams. ?What can I expect for my preventive care visit? ?Counseling ?Your health care provider may ask you questions about your: ?Medical history, including: ?Past medical problems. ?Family medical history. ?Pregnancy history. ?Current health, including: ?Menstrual cycle. ?Method of birth control. ?Emotional well-being. ?Home life and relationship well-being. ?Sexual activity and sexual health. ?Lifestyle, including: ?Alcohol, nicotine or tobacco, and drug use. ?Access to firearms. ?Diet, exercise, and sleep habits. ?Work and work Statistician. ?Sunscreen use. ?Safety issues such as seatbelt and bike helmet use. ?Physical exam ?Your health care provider will check your: ?Height and weight. These may be used to calculate your BMI (body mass index). BMI is a measurement that tells if you are at a healthy weight. ?Waist circumference. This measures the distance around your waistline. This measurement also tells if you are at a healthy weight and may help predict your risk of certain diseases, such as type 2 diabetes and high blood pressure. ?Heart rate and blood pressure. ?Body temperature. ?Skin for abnormal spots. ?What immunizations do I need? ?Vaccines are usually given at various ages, according to a schedule. Your health care provider will recommend vaccines for you based on your age, medical history, and lifestyle or other factors, such as travel or where you work. ?What tests do I need? ?Screening ?Your health care provider may recommend screening tests for certain conditions. This may include: ?Lipid and cholesterol levels. ?Diabetes screening. This is done by checking your blood sugar (glucose) after you have not eaten for a while (fasting). ?Pelvic exam and  Pap test. ?Hepatitis B test. ?Hepatitis C test. ?HIV (human immunodeficiency virus) test. ?STI (sexually transmitted infection) testing, if you are at risk. ?Lung cancer screening. ?Colorectal cancer screening. ?Mammogram. Talk with your health care provider about when you should start having regular mammograms. This may depend on whether you have a family history of breast cancer. ?BRCA-related cancer screening. This may be done if you have a family history of breast, ovarian, tubal, or peritoneal cancers. ?Bone density scan. This is done to screen for osteoporosis. ?Talk with your health care provider about your test results, treatment options, and if necessary, the need for more tests. ?Follow these instructions at home: ?Eating and drinking ? ?Eat a diet that includes fresh fruits and vegetables, whole grains, lean protein, and low-fat dairy products. ?Take vitamin and mineral supplements as recommended by your health care provider. ?Do not drink alcohol if: ?Your health care provider tells you not to drink. ?You are pregnant, may be pregnant, or are planning to become pregnant. ?If you drink alcohol: ?Limit how much you have to 0-1 drink a day. ?Know how much alcohol is in your drink. In the [place] one drink equals one 12 oz bottle of beer (355 mL), one 5 oz glass of wine (148 mL), or one 1? oz glass of hard liquor (44 mL). ?Lifestyle ?Brush your teeth every morning and night with fluoride toothpaste. Floss one time each day. ?Exercise for at least 30 minutes 5 or more days each week. ?Do not use any products that contain nicotine or tobacco. These products include cigarettes, chewing tobacco, and vaping devices, such as e-cigarettes. If you need help quitting, ask your health care provider. ?Do not use drugs. ?If you are sexually active, practice safe sex. Use a condom or  other form of protection to prevent STIs. ?If you do not wish to become pregnant, use a form of birth control. If you plan to become  pregnant, see your health care provider for a prepregnancy visit. ?Take aspirin only as told by your health care provider. Make sure that you understand how much to take and what form to take. Work with your health care provider to find out whether it is safe and beneficial for you to take aspirin daily. ?Find healthy ways to manage stress, such as: ?Meditation, yoga, or listening to music. ?Journaling. ?Talking to a trusted person. ?Spending time with friends and family. ?Minimize exposure to UV radiation to reduce your risk of skin cancer. ?Safety ?Always wear your seat belt while driving or riding in a vehicle. ?Do not drive: ?If you have been drinking alcohol. Do not ride with someone who has been drinking. ?When you are tired or distracted. ?While texting. ?If you have been using any mind-altering substances or drugs. ?Wear a helmet and other protective equipment during sports activities. ?If you have firearms in your house, make sure you follow all gun safety procedures. ?Seek help if you have been physically or sexually abused. ?What's next? ?Visit your health care provider once a year for an annual wellness visit. ?Ask your health care provider how often you should have your eyes and teeth checked. ?Stay up to date on all vaccines. ?This information is not intended to replace advice given to you by your health care provider. Make sure you discuss any questions you have with your health care provider. ?Document Revised: 10/13/2020 Document Reviewed: 10/13/2020 ?Elsevier Patient Education ? [place]. ? ?

## 2021-07-18 ENCOUNTER — Encounter: Payer: Self-pay | Admitting: Family Medicine

## 2021-07-18 ENCOUNTER — Ambulatory Visit (INDEPENDENT_AMBULATORY_CARE_PROVIDER_SITE_OTHER): Payer: Managed Care, Other (non HMO) | Admitting: Family Medicine

## 2021-07-18 VITALS — BP 122/84 | HR 86 | Resp 16 | Ht 63.0 in | Wt 173.0 lb

## 2021-07-18 DIAGNOSIS — Z23 Encounter for immunization: Secondary | ICD-10-CM

## 2021-07-18 DIAGNOSIS — Z0000 Encounter for general adult medical examination without abnormal findings: Secondary | ICD-10-CM

## 2021-07-18 DIAGNOSIS — Z1231 Encounter for screening mammogram for malignant neoplasm of breast: Secondary | ICD-10-CM | POA: Diagnosis not present

## 2021-07-18 DIAGNOSIS — E669 Obesity, unspecified: Secondary | ICD-10-CM

## 2021-07-18 DIAGNOSIS — E7800 Pure hypercholesterolemia, unspecified: Secondary | ICD-10-CM

## 2021-07-18 DIAGNOSIS — Z131 Encounter for screening for diabetes mellitus: Secondary | ICD-10-CM

## 2021-07-18 DIAGNOSIS — Z130 Encounter for screening for diseases of the blood and blood-forming organs and certain disorders involving the immune mechanism: Secondary | ICD-10-CM

## 2021-07-19 LAB — COMPLETE METABOLIC PANEL WITH GFR
AG Ratio: 1.5 (calc) (ref 1.0–2.5)
ALT: 20 U/L (ref 6–29)
AST: 19 U/L (ref 10–35)
Albumin: 4.4 g/dL (ref 3.6–5.1)
Alkaline phosphatase (APISO): 60 U/L (ref 37–153)
BUN: 7 mg/dL (ref 7–25)
CO2: 30 mmol/L (ref 20–32)
Calcium: 10.3 mg/dL (ref 8.6–10.4)
Chloride: 105 mmol/L (ref 98–110)
Creat: 0.91 mg/dL (ref 0.50–1.03)
Globulin: 3 g/dL (calc) (ref 1.9–3.7)
Glucose, Bld: 107 mg/dL — ABNORMAL HIGH (ref 65–99)
Potassium: 5.2 mmol/L (ref 3.5–5.3)
Sodium: 142 mmol/L (ref 135–146)
Total Bilirubin: 0.6 mg/dL (ref 0.2–1.2)
Total Protein: 7.4 g/dL (ref 6.1–8.1)
eGFR: 74 mL/min/{1.73_m2} (ref >=60)

## 2021-07-19 LAB — LIPID PANEL
Cholesterol: 207 mg/dL — ABNORMAL HIGH (ref <200)
HDL: 62 mg/dL (ref >=50)
LDL Cholesterol (Calc): 123 mg/dL (calc) — ABNORMAL HIGH
Non-HDL Cholesterol (Calc): 145 mg/dL (calc) — ABNORMAL HIGH (ref <130)
Total CHOL/HDL Ratio: 3.3 (calc) (ref <5.0)
Triglycerides: 108 mg/dL (ref <150)

## 2021-07-19 LAB — CBC WITH DIFFERENTIAL/PLATELET
Absolute Monocytes: 398 cells/uL (ref 200–950)
Basophils Absolute: 31 cells/uL (ref 0–200)
Basophils Relative: 0.8 %
Eosinophils Absolute: 51 cells/uL (ref 15–500)
Eosinophils Relative: 1.3 %
HCT: 43.2 % (ref 35.0–45.0)
Hemoglobin: 14.2 g/dL (ref 11.7–15.5)
Lymphs Abs: 1373 cells/uL (ref 850–3900)
MCH: 28.9 pg (ref 27.0–33.0)
MCHC: 32.9 g/dL (ref 32.0–36.0)
MCV: 87.8 fL (ref 80.0–100.0)
MPV: 11.7 fL (ref 7.5–12.5)
Monocytes Relative: 10.2 %
Neutro Abs: 2048 cells/uL (ref 1500–7800)
Neutrophils Relative %: 52.5 %
Platelets: 241 10*3/uL (ref 140–400)
RBC: 4.92 10*6/uL (ref 3.80–5.10)
RDW: 13.8 % (ref 11.0–15.0)
Total Lymphocyte: 35.2 %
WBC: 3.9 10*3/uL (ref 3.8–10.8)

## 2021-07-19 LAB — HEMOGLOBIN A1C
Hgb A1c MFr Bld: 5.2 % of total Hgb (ref <5.7)
Mean Plasma Glucose: 103 mg/dL
eAG (mmol/L): 5.7 mmol/L

## 2021-08-04 ENCOUNTER — Telehealth: Payer: Self-pay | Admitting: Family Medicine

## 2021-08-04 NOTE — Telephone Encounter (Signed)
Pt would like Dr. Ancil Boozer to send Rx for shingrix vaccine to preferred pharmacy stated her Insurance does cover.  ? ?Pt is requesting a call back  ? ?[place] [address] [place] [address]  ?[place] ([place]) [place] [address]  ?Phone: [phone] Fax: [phone]  ?Hours: Not open 24 hours  ? ?

## 2021-08-05 ENCOUNTER — Other Ambulatory Visit: Payer: Self-pay

## 2021-08-05 DIAGNOSIS — Z23 Encounter for immunization: Secondary | ICD-10-CM

## 2021-08-05 MED ORDER — SHINGRIX 50 MCG/0.5ML IM SUSR: 0.5000 mL | Freq: Once | INTRAMUSCULAR | 1 refills | Status: AC

## 2021-08-05 NOTE — Telephone Encounter (Signed)
Shingrix rx sent ?

## 2021-08-23 ENCOUNTER — Ambulatory Visit
Admission: RE | Admit: 2021-08-23 | Discharge: 2021-08-23 | Disposition: A | Discharge status: Home / Self Care | Payer: Managed Care, Other (non HMO) | Source: Ambulatory Visit | Attending: Family Medicine | Admitting: Family Medicine

## 2021-08-23 DIAGNOSIS — Z1231 Encounter for screening mammogram for malignant neoplasm of breast: Secondary | ICD-10-CM | POA: Insufficient documentation

## 2021-11-04 ENCOUNTER — Telehealth (INDEPENDENT_AMBULATORY_CARE_PROVIDER_SITE_OTHER): Payer: Managed Care, Other (non HMO) | Admitting: Internal Medicine

## 2021-11-04 ENCOUNTER — Encounter: Payer: Self-pay | Admitting: Internal Medicine

## 2021-11-04 VITALS — HR 68

## 2021-11-04 DIAGNOSIS — J069 Acute upper respiratory infection, unspecified: Secondary | ICD-10-CM

## 2021-11-04 MED ORDER — FEXOFENADINE-PSEUDOEPHED ER 60-120 MG PO TB12: 1.0000 | ORAL_TABLET | Freq: Two times a day (BID) | ORAL | 0 refills | Status: DC

## 2021-11-04 MED ORDER — BENZONATATE 100 MG PO CAPS: 100.0000 mg | ORAL_CAPSULE | Freq: Two times a day (BID) | ORAL | 0 refills | Status: DC | PRN

## 2021-11-04 MED ORDER — METHYLPREDNISOLONE 4 MG PO TBPK: ORAL_TABLET | ORAL | 0 refills | Status: DC

## 2021-11-04 NOTE — Progress Notes (Signed)
Virtual Visit via Video Note  I connected with Stephanie Pineda on 11/04/21 at 11:20 AM EDT by a video enabled telemedicine application and verified that I am speaking with the correct person using two identifiers.  Location: Patient: Home Provider: [facility]   I discussed the limitations of evaluation and management by telemedicine and the availability of in person appointments. The patient expressed understanding and agreed to proceed.  History of Present Illness:  Stephanie Pineda is a 56 year old female presenting via telemedicine for cough, nasal congestion and sore throat.  Symptoms started on 10/29/2021, about 6 days ago.  Of note, her brother recently passed away last week and she has been crying more, which seems to have precipitated her symptoms.  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  Tested negative for COVID at home twice, tested negative this morning.  URI Compliant:  -Worst symptom: nasal congestion, sore throat -Fever: yes subjective  -Cough: yes dry  -Shortness of breath: no -Wheezing: no -Chest tightness: no -Chest congestion: no -Nasal congestion: yes -Runny nose: no -Post nasal drip: yes -Sneezing: yes -Sore throat: yes -Headache: yes -Face pain: yes under eye bilateral, however pain seems to be slightly worse on the right compared to left -Ear pain: no  -Ear pressure: no  -Vomiting: no -Fatigue: no -Sick contacts: yes; mother  -Context: worse -Relief with OTC cold/cough medications: no  -Treatments attempted: Excedrin Migraine  Observations/Objective:  General: well appearing, no acute distress ENT: conjunctiva normal appearing bilaterally, nasal congestion with clear rhinorrhea noted Skin: no rashes, cyanosis or abnormal bruising noted Neuro: Answers all questions appropriately  Assessment and Plan:  1. Viral upper respiratory tract infection: Symptoms consistent with viral URI versus allergic  symptoms.  Patient is out of the window for antiviral treatment if she were to be COVID-positive, however she had to home negative tests.  Will treat symptomatically with Medrol Dosepak, cough suppressant and Allegra with decongestant to help with symptoms.  Recommend rest and staying well-hydrated.  Follow-up as needed if symptoms worsen or fail to improve.  - methylPREDNISolone (MEDROL DOSEPAK) 4 MG TBPK tablet; Day 1: Take 8 mg (2 tablets) before breakfast, 4 mg (1 tablet) after lunch, 4 mg (1 tablet) after supper, and 8 mg (2 tablets) at bedtime. Day 2:Take 4 mg (1 tablet) before breakfast, 4 mg (1 tablet) after lunch, 4 mg (1 tablet) after supper, and 8 mg (2 tablets) at bedtime. Day 3: Take 4 mg (1 tablet) before breakfast, 4 mg (1 tablet) after lunch, 4 mg (1 tablet) after supper, and 4 mg (1 tablet) at bedtime. Day 4: Take 4 mg (1 tablet) before breakfast, 4 mg (1 tablet) after lunch, and 4 mg (1 tablet) at bedtime. Day 5: Take 4 mg (1 tablet) before breakfast and 4 mg (1 tablet) at bedtime. Day 6: Take 4 mg (1 tablet) before breakfast.  Dispense: 1 each; Refill: 0 - benzonatate (TESSALON) 100 MG capsule; Take 1 capsule (100 mg total) by mouth 2 (two) times daily as needed for cough.  Dispense: 20 capsule; Refill: 0 - fexofenadine-pseudoephedrine (ALLEGRA-D ALLERGY & CONGESTION) 60-120 MG 12 hr tablet; Take 1 tablet by mouth 2 (two) times daily.  Dispense: 30 tablet; Refill: 0  Follow Up Instructions: As needed if symptoms worsen or fail to improve    I discussed the assessment and treatment plan with the patient. The patient was provided an opportunity to ask questions and all were  answered. The patient agreed with the plan and demonstrated an understanding of the instructions.   The patient was advised to call back or seek an in-person evaluation if the symptoms worsen or if the condition fails to improve as anticipated.  I provided 12 minutes of non-face-to-face time during this  encounter.   Teodora Medici, DO

## 2021-11-09 ENCOUNTER — Ambulatory Visit: Payer: Self-pay | Admitting: *Deleted

## 2021-11-09 NOTE — Telephone Encounter (Signed)
  Chief Complaint: cough Symptoms: cough, hoarseness, scratch throat  Frequency: Virtual visit- 11/04/21 Pertinent Negatives: Patient denies fever, SOB Disposition: '[]'$ ED /'[]'$ Urgent Care (no appt availability in office) / '[]'$ Appointment(In office/virtual)/ '[]'$  [facility]/ '[]'$ Home Care/ '[x]'$ Refused Recommended Disposition /'[]'$ [place] [facility] [facility]/ '[]'$  Follow-up with PCP Additional Notes: Patient reports she took last steriod today- seemed to get better maybe the first day of treatment. Patient reports productive cough- clear sputum- bad coughing spells, hoarseness, and scratch sore throat. Patient advised per last OV if not better needs to be seen- patient wants to wait and see if she improved- declined OV when offered- is there anything else for patient to try?  Reason for Disposition  [1] Continuous (nonstop) coughing interferes with work or school AND [2] no improvement using cough treatment per Care Advice  Answer Assessment - Initial Assessment Questions 1. ONSET: "When did the cough begin?"      Virtual visit- 11/04/21 2. SEVERITY: "How bad is the cough today?"      Spells/spasms 3. SPUTUM: "Describe the color of your sputum" (none, dry cough; clear, white, yellow, green)     clear 4. HEMOPTYSIS: "Are you coughing up any blood?" If so ask: "How much?" (flecks, streaks, tablespoons, etc.)     no 5. DIFFICULTY BREATHING: "Are you having difficulty breathing?" If Yes, ask: "How bad is it?" (e.g., mild, moderate, severe)    - MILD: No SOB at rest, mild SOB with walking, speaks normally in sentences, can lie down, no retractions, pulse < 100.    - MODERATE: SOB at rest, SOB with minimal exertion and prefers to sit, cannot lie down flat, speaks in phrases, mild retractions, audible wheezing, pulse 100-120.    - SEVERE: Very SOB at rest, speaks in single words, struggling to breathe, sitting hunched forward, retractions, pulse > 120      No SOB 6. FEVER: "Do you have a fever?" If Yes, ask:  "What is your temperature, how was it measured, and when did it start?"     no 7. CARDIAC HISTORY: "Do you have any history of heart disease?" (e.g., heart attack, congestive heart failure)      no 8. LUNG HISTORY: "Do you have any history of lung disease?"  (e.g., pulmonary embolus, asthma, emphysema)     no 9. PE RISK FACTORS: "Do you have a history of blood clots?" (or: recent major surgery, recent prolonged travel, bedridden)     no 10. OTHER SYMPTOMS: "Do you have any other symptoms?" (e.g., runny nose, wheezing, chest pain)       Throat raw, headache after cough 11. PREGNANCY: "Is there any chance you are pregnant?" "When was your last menstrual period?"         12. TRAVEL: "Have you traveled out of the country in the last month?" (e.g., travel history, exposures)       No known exposure  Protocols used: Cough - Acute Productive-A-AH

## 2021-11-09 NOTE — Telephone Encounter (Signed)
Summary: coughing   The patient was previously seen for coughing but shares that they are continuing to experience discomfort and have also noticed that their voice is beginning to weaken   The patient shares that they are continuing to cough and experiencing a sore throat as a result of that   Please contact further when possible

## 2022-05-17 IMAGING — MG MM DIGITAL SCREENING BILAT W/ TOMO AND CAD
8 series · 8 of 24 positions shown · non-contrast
Comparison: Previous exam(s).

CLINICAL DATA: Screening.

EXAM:
DIGITAL SCREENING BILATERAL MAMMOGRAM WITH TOMOSYNTHESIS AND CAD
TECHNIQUE: Bilateral screening digital craniocaudal and mediolateral oblique
mammograms were obtained. Bilateral screening digital breast
tomosynthesis was performed. The images were evaluated with
computer-aided detection.

[L MLO synth-2D]
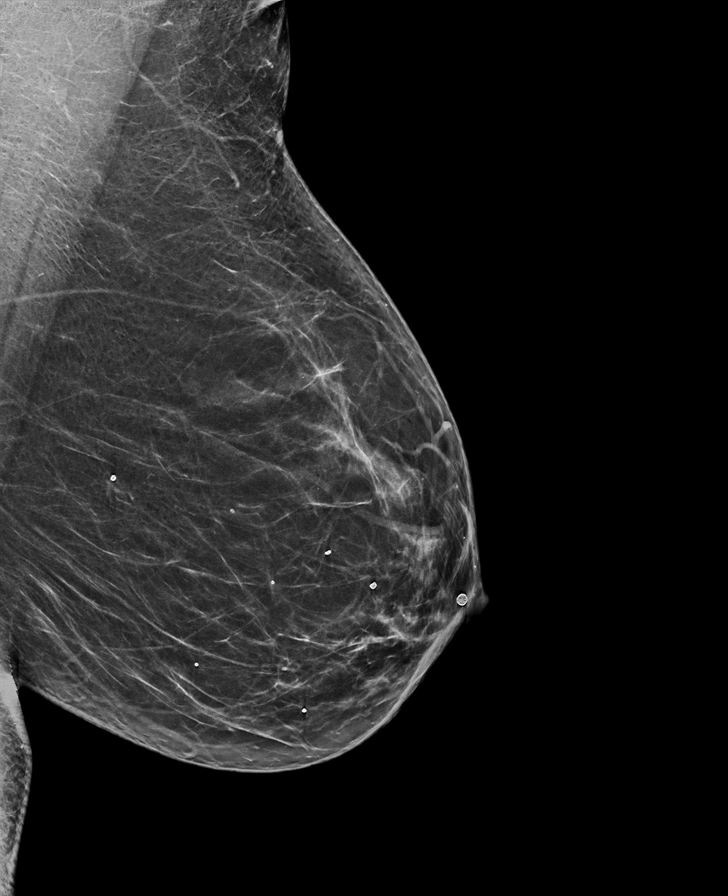

[R CC synth-2D]
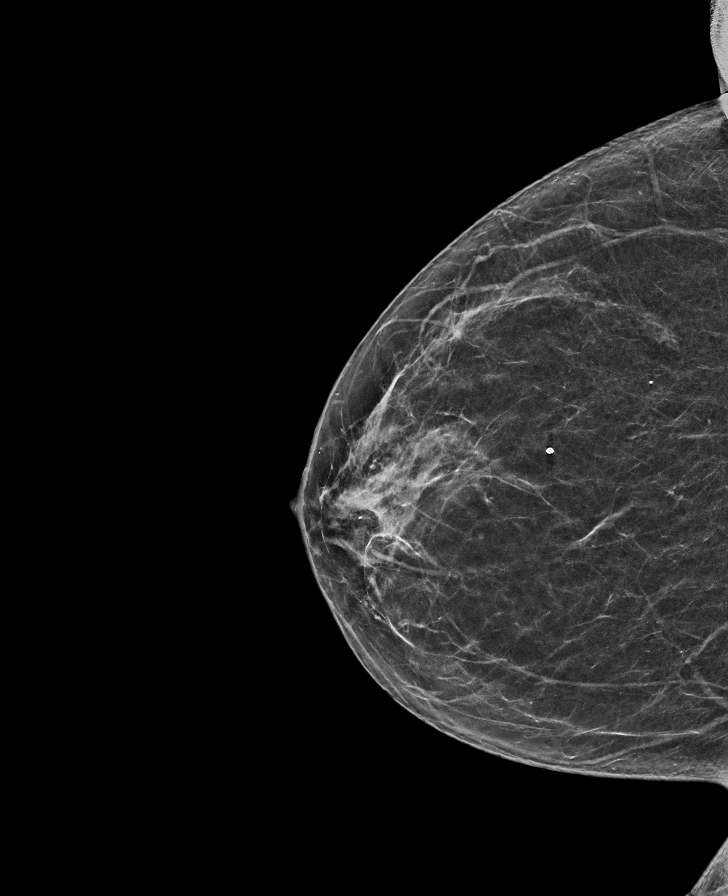

[L CC synth-2D]
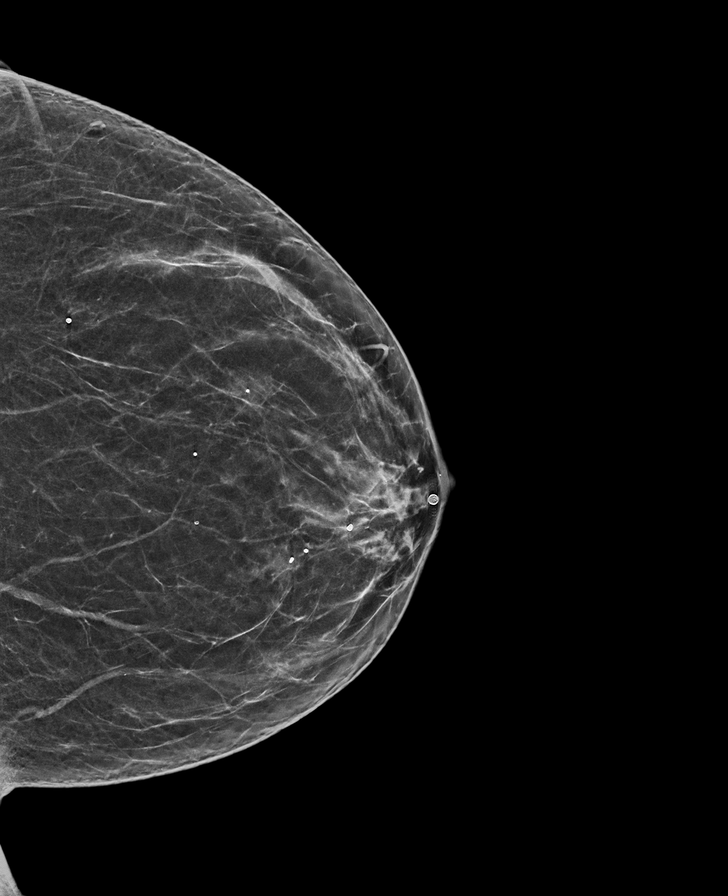

[R MLO synth-2D]
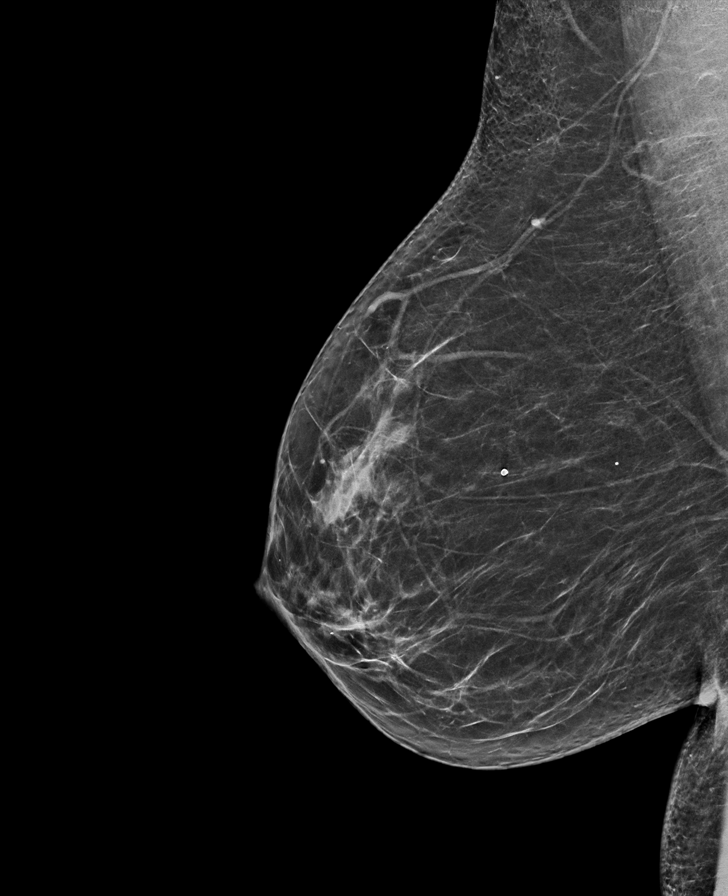

[L CC tomo · tomo slice 35/68.0]
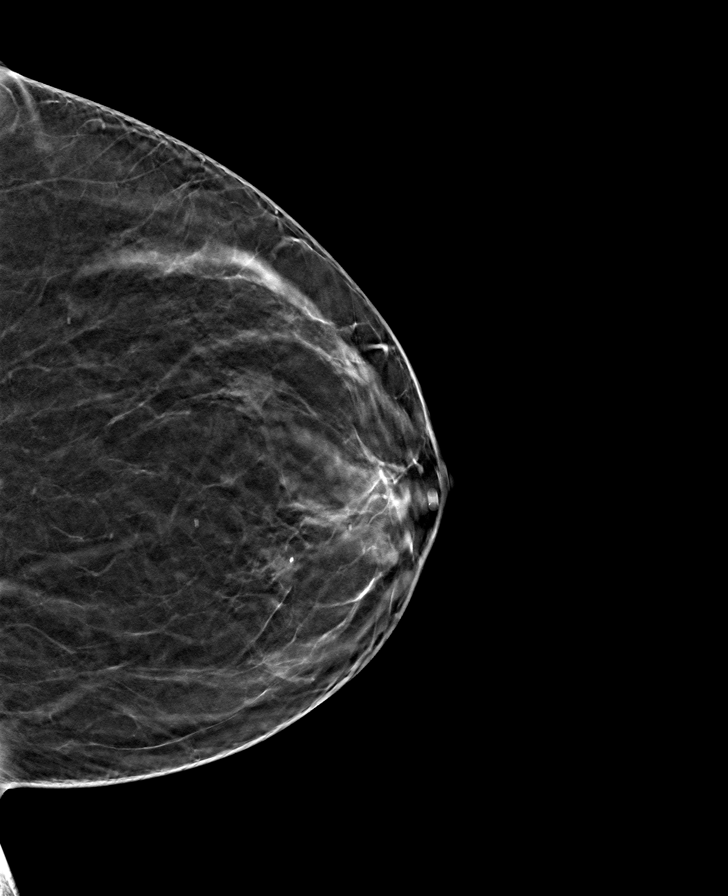

[R MLO tomo · tomo slice 37/72.0]
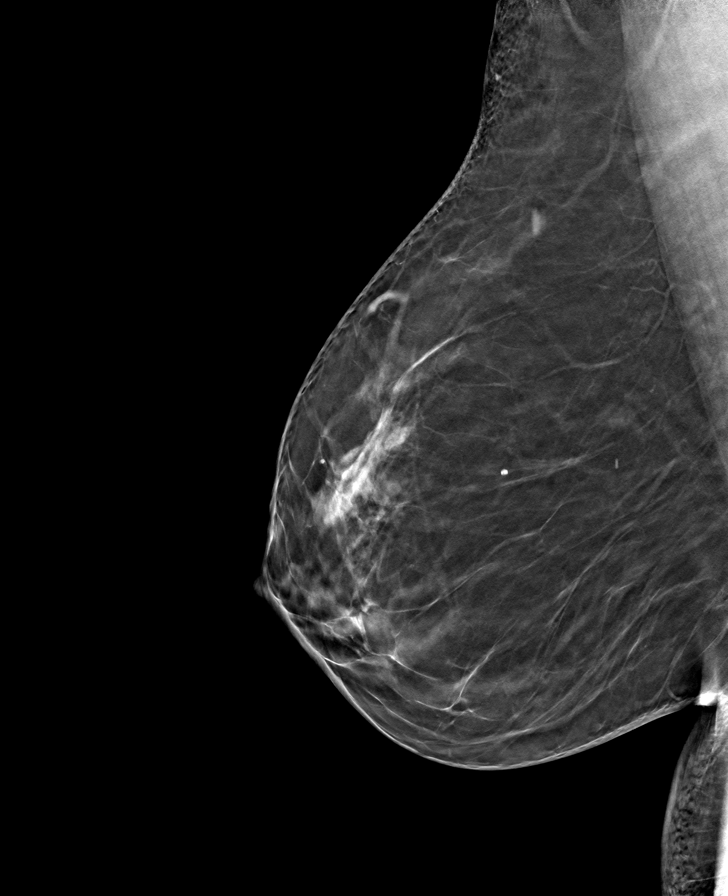

[L MLO tomo · tomo slice 39/78.0]
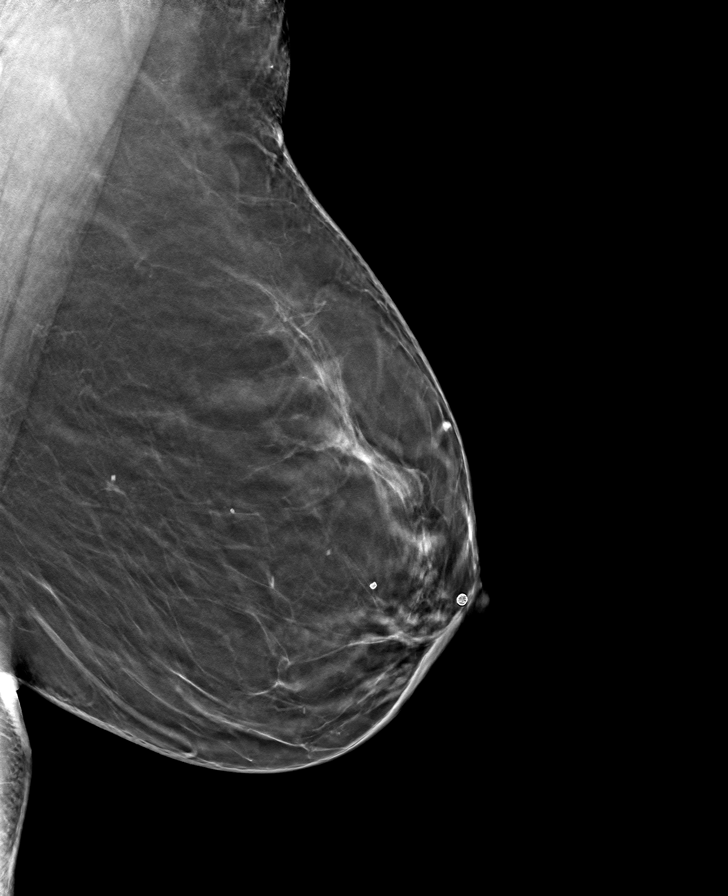

[R CC tomo · tomo slice 33/65.0]
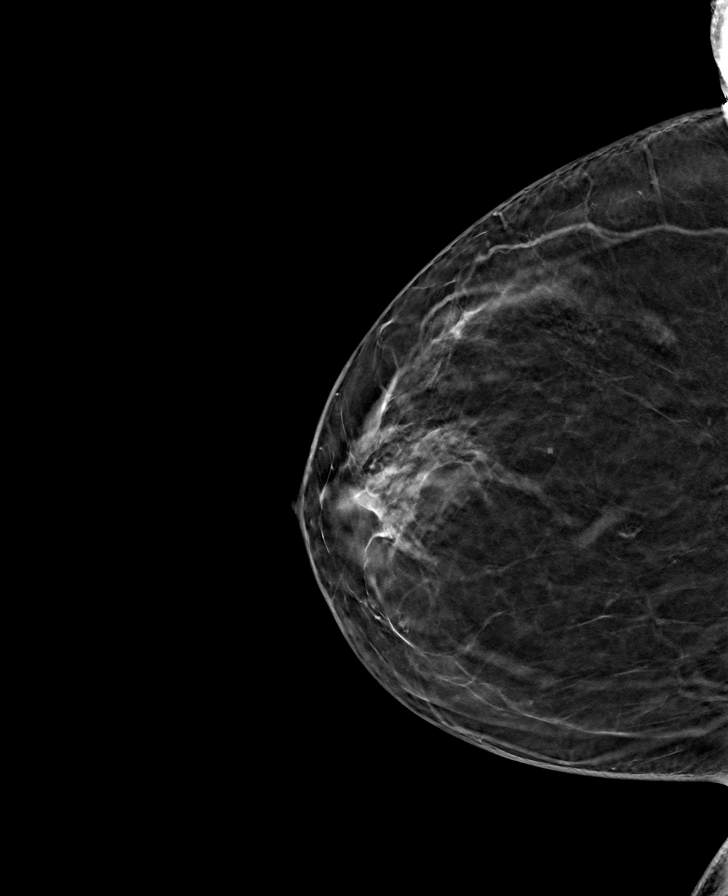

[8 of 24 positions shown; findings below may reference images not displayed]

ACR Breast Density Category b: There are scattered areas of
fibroglandular density.
FINDINGS: There are no findings suspicious for malignancy.
IMPRESSION: No mammographic evidence of malignancy. A result letter of this
screening mammogram will be mailed directly to the patient.

RECOMMENDATION:
Screening mammogram in one year. (Code:51-O-LD2)

BI-RADS CATEGORY  1: Negative.

## 2022-07-21 NOTE — Progress Notes (Unsigned)
Name: Stephanie Pineda   MRN: [id number]    DOB: 11-09-1965   Date:07/24/2022       Progress Note  Subjective  Chief Complaint  Annual Exam   HPI  Patient presents for annual CPE.  Diet: she eats breakfast and lunch, a snack for dinner - usually carrying for her mother  Exercise: she walks 4 miles per day  Last Eye Exam: up to date  Last Dental Exam: up to date   [employer] Visit from 07/24/2022 in [facility]  AUDIT-C Score 0      Depression: Phq 9 is  negative    07/24/2022    7:46 AM 11/04/2021    9:52 AM 07/18/2021    9:04 AM 12/17/2020    9:19 AM 06/16/2020   10:24 AM  Depression screen PHQ 2/9  Decreased Interest 0 0 0 0 0  Down, Depressed, Hopeless 0 0 0 0 0  PHQ - 2 Score 0 0 0 0 0  Altered sleeping  0 0 0   Tired, decreased energy  0 0 0   Change in appetite  0 0 0   Feeling bad or failure about yourself   0 0 0   Trouble concentrating  0 0 0   Moving slowly or fidgety/restless  0 0 0   Suicidal thoughts  0 0 0   PHQ-9 Score  0 0 0   Difficult doing work/chores  Not difficult at all  Not difficult at all    Hypertension: BP Readings from Last 3 Encounters:  07/24/22 120/68  07/18/21 122/84  12/17/20 130/78   Obesity: Wt Readings from Last 3 Encounters:  07/24/22 161 lb 12.8 oz (73.4 kg)  07/18/21 173 lb (78.5 kg)  12/17/20 193 lb 12.8 oz (87.9 kg)   BMI Readings from Last 3 Encounters:  07/24/22 28.66 kg/m  07/18/21 30.65 kg/m  12/17/20 34.33 kg/m     Vaccines:    Tdap: 11/20/2016 Shingrix: up to date  Pneumonia: N/A Flu: 01/29/2021, refused  COVID-19:09/16/2019   Hep C Screening: up to date  STD testing and prevention (HIV/chl/gon/syphilis): N/A Intimate partner violence: negative screen  Sexual History :not sexually active  Menstrual History/LMP/Abnormal Bleeding: post menopausal bleeding  Discussed importance of follow up if any post-menopausal bleeding: yes  Incontinence Symptoms: negative for  symptoms   Breast cancer:  - Last Mammogram: 08/23/2021 - BRCA gene screening: N/A  Osteoporosis Prevention : Discussed high calcium and vitamin D supplementation, weight bearing exercises Bone density :discussed but insurance does not cover until age 57   Cervical cancer screening: today   Skin cancer: Discussed monitoring for atypical lesions - discussed importance of seeing Dermatologist - family history of melanoma  Colorectal cancer: 02/18/2015   Lung cancer:  Low Dose CT Chest recommended if Age 40-80 years, 88 pack-year currently smoking OR have quit w/in 15years. Patient does not qualify for screen   ECG: 06/16/2020  Advanced Care Planning: A voluntary discussion about advance care planning including the explanation and discussion of advance directives.  Discussed health care proxy and Living will, and the patient was able to identify a health care proxy as daughter .  Patient does not have a living will and power of attorney of health care   Lipids: Lab Results  Component Value Date   CHOL 207 (H) 07/18/2021   CHOL 243 (H) 06/16/2020   CHOL 183 06/06/2019   Lab Results  Component Value Date   HDL 62 07/18/2021  HDL 69 06/16/2020   HDL 66 06/06/2019   Lab Results  Component Value Date   LDLCALC 123 (H) 07/18/2021   LDLCALC 151 (H) 06/16/2020   LDLCALC 101 (H) 06/06/2019   Lab Results  Component Value Date   TRIG 108 07/18/2021   TRIG 117 06/16/2020   TRIG 85 06/06/2019   Lab Results  Component Value Date   CHOLHDL 3.3 07/18/2021   CHOLHDL 3.5 06/16/2020   CHOLHDL 2.8 06/06/2019   No results found for: "LDLDIRECT"  Glucose: Glucose, Bld  Date Value Ref Range Status  07/18/2021 107 (H) 65 - 99 mg/dL Final    Comment:    .            Fasting reference interval . For someone without known diabetes, a glucose value between 100 and 125 mg/dL is consistent with prediabetes and should be confirmed with a follow-up test. .   12/17/2020 95 65 - 99 mg/dL  Final    Comment:    .            Fasting reference interval .   06/16/2020 97 65 - 99 mg/dL Final    Comment:    .            Fasting reference interval .     Patient Active Problem List   Diagnosis Date Noted   Vitamin D deficiency 05/16/2017   Hypercholesteremia     Past Surgical History:  Procedure Laterality Date   COLONOSCOPY WITH PROPOFOL N/A 02/18/2015   Procedure: COLONOSCOPY WITH PROPOFOL;  Surgeon: Lucilla Lame, MD;  Location: [place];  Service: Endoscopy;  Laterality: N/A;   ENDOMETRIAL ABLATION  2009   laser surgery for Dysplasia   ESOPHAGOGASTRODUODENOSCOPY     KNEE SURGERY     TUBAL LIGATION      Family History  Problem Relation Age of Onset   Asthma Mother    Hyperlipidemia Mother    Thyroid disease Mother    COPD Mother    Osteoporosis Mother    Heart disease Mother    Hyperlipidemia Father    Heart disease Father    Hypertension Father    COPD Father    Glaucoma Father    Cancer Sister        melanoma   Thyroid disease Daughter    Diabetes Maternal Grandmother    Diabetes Brother    Heart disease Brother    Hypertension Brother    Lymphoma Brother    Breast cancer Neg Hx     Social History   Socioeconomic History   Marital status: Married    Spouse name: Lanny Hurst   Number of children: 1   Years of education: 12   Highest education level: Some college, no degree  Occupational History   Not on file  Tobacco Use   Smoking status: Former    Packs/day: 1.00    Years: 20.00    Additional pack years: 0.00    Total pack years: 20.00    Types: Cigarettes    Quit date: 05/01/2006    Years since quitting: 16.2   Smokeless tobacco: Never  Vaping Use   Vaping Use: Never used  Substance and Sexual Activity   Alcohol use: Yes    Comment: 2 a month   Drug use: No   Sexual activity: Not Currently    Partners: Male    Birth control/protection: Post-menopausal  Other Topics Concern   Not on file  Social History Narrative  Not on file   Social Determinants of Health   Financial Resource Strain: Low Risk  (07/24/2022)   Overall Financial Resource Strain (CARDIA)    Difficulty of Paying Living Expenses: Not hard at all  Food Insecurity: No Food Insecurity (07/24/2022)   Hunger Vital Sign    Worried About Running Out of Food in the Last Year: Never true    Ran Out of Food in the Last Year: Never true  Transportation Needs: No Transportation Needs (07/24/2022)   PRAPARE - Hydrologist (Medical): No    Lack of Transportation (Non-Medical): No  Physical Activity: Sufficiently Active (07/24/2022)   Exercise Vital Sign    Days of Exercise per Week: 5 days    Minutes of Exercise per Session: 60 min  Stress: No Stress Concern Present (07/24/2022)   [place]    Feeling of Stress : Only a little  Social Connections: Moderately Integrated (07/24/2022)   Social Connection and Isolation Panel [NHANES]    Frequency of Communication with Friends and Family: Twice a week    Frequency of Social Gatherings with Friends and Family: Once a week    Attends Religious Services: 1 to 4 times per year    Active Member of [employer] or Organizations: No    Attends Archivist Meetings: Never    Marital Status: Married  Human resources officer Violence: Not At Risk (07/24/2022)   Humiliation, Afraid, Rape, and Kick questionnaire    Fear of Current or Ex-Partner: No    Emotionally Abused: No    Physically Abused: No    Sexually Abused: No     Current Outpatient Medications:    BL CALCIUM-MAGNESIUM-ZINC PO, Take by mouth. (Patient not taking: Reported on 07/24/2022), Disp: , Rfl:    VITAMIN D, CHOLECALCIFEROL, PO, Take by mouth. (Patient not taking: Reported on 07/24/2022), Disp: , Rfl:   No Known Allergies   ROS  Constitutional: Negative for fever , positive for weight change - she states more stressed, her brother died and she became  her mother's caregiver, husband has health issues .  Respiratory: Negative for cough and shortness of breath.   Cardiovascular: Negative for chest pain or palpitations.  Gastrointestinal: Negative for abdominal pain, no bowel changes.  Musculoskeletal: Negative for gait problem or joint swelling.  Skin: Negative for rash. Thinning and brittle nails  Neurological: Negative for dizziness or headache.  No other specific complaints in a complete review of systems (except as listed in HPI above).   Objective  Vitals:   07/24/22 0747  BP: 120/68  Pulse: 74  Resp: 16  Temp: 97.8 F (36.6 C)  TempSrc: Oral  SpO2: 98%  Weight: 161 lb 12.8 oz (73.4 kg)  Height: 5\' 3"  (1.6 m)    Body mass index is 28.66 kg/m.  Physical Exam  Constitutional: Patient appears well-developed and well-nourished. No distress.  HENT: Head: Normocephalic and atraumatic. Ears: B TMs ok, no erythema or effusion; Nose: Nose normal. Mouth/Throat: Oropharynx is clear and moist. No oropharyngeal exudate.  Eyes: Conjunctivae and EOM are normal. Pupils are equal, round, and reactive to light. No scleral icterus.  Neck: Normal range of motion. Neck supple. No JVD present. No thyromegaly present.  Cardiovascular: Normal rate, regular rhythm and normal heart sounds.  No murmur heard. No BLE edema. Pulmonary/Chest: Effort normal and breath sounds normal. No respiratory distress. Abdominal: Soft. Bowel sounds are normal, no distension. There is no tenderness. no masses  Breast: no lumps or masses, no nipple discharge or rashes FEMALE GENITALIA:  External genitalia normal External urethra normal Vaginal vault normal without discharge or lesions Cervix normal without discharge or lesions Bimanual exam normal without masses RECTAL: not done  Musculoskeletal: Normal range of motion, no joint effusions. No gross deformities Neurological: he is alert and oriented to person, place, and time. No cranial nerve deficit.  Coordination, balance, strength, speech and gait are normal.  Skin: Skin is warm and dry. No rash noted. No erythema.  Psychiatric: Patient has a normal mood and affect. behavior is normal. Judgment and thought content normal.    Fall Risk:    07/24/2022    7:46 AM 11/04/2021    9:52 AM 07/18/2021    9:04 AM 12/17/2020    9:16 AM 06/16/2020   10:24 AM  Fall Risk   Falls in the past year? 1 0 0 0 0  Number falls in past yr: 0 0 0 0 0  Injury with Fall? 1 0 0 0 0  Risk for fall due to : History of fall(s)  No Fall Risks    Follow up   Falls prevention discussed      Functional Status Survey: Is the patient deaf or have difficulty hearing?: Yes Does the patient have difficulty seeing, even when wearing glasses/contacts?: No Does the patient have difficulty concentrating, remembering, or making decisions?: No Does the patient have difficulty walking or climbing stairs?: No Does the patient have difficulty dressing or bathing?: No Does the patient have difficulty doing errands alone such as visiting a doctor's office or shopping?: No   Assessment & Plan  1. Well adult exam  - Lipid panel - COMPLETE METABOLIC PANEL WITH GFR - B12 and Folate Panel - TSH - Hemoglobin A1c - CBC with Differential/Platelet - MM 3D SCREENING MAMMOGRAM BILATERAL BREAST; Future  Discussed importance of coming back if she continues to lose weight. She states she goes to her employee health as needed  Discussed therapy due to loss of a sibling and also becoming a caregiver and she will think about it   2. Hypercholesteremia  - Lipid panel  3. Breast cancer screening by mammogram  - MM 3D SCREENING MAMMOGRAM BILATERAL BREAST; Future  4. Hair loss  - B12 and Folate Panel - TSH  5. Diabetes mellitus screening  - Hemoglobin A1c  6. Screening for deficiency anemia  - CBC with Differential/Platelet  7. Nail brittleness  - B12 and Folate Panel   8. Vitamin D deficiency  - VITAMIN D 25  Hydroxy (Vit-D Deficiency, Fractures)  9. Cervical cancer screening  - Cytology - PAP   -USPSTF grade A and B recommendations reviewed with patient; age-appropriate recommendations, preventive care, screening tests, etc discussed and encouraged; healthy living encouraged; see AVS for patient education given to patient -Discussed importance of 150 minutes of physical activity weekly, eat two servings of fish weekly, eat one serving of tree nuts ( cashews, pistachios, pecans, almonds.Marland Kitchen) every other day, eat 6 servings of fruit/vegetables daily and drink plenty of water and avoid sweet beverages.   -Reviewed Health Maintenance: Yes.

## 2022-07-24 ENCOUNTER — Other Ambulatory Visit (HOSPITAL_COMMUNITY)
Admission: RE | Admit: 2022-07-24 | Discharge: 2022-07-24 | Disposition: A | Discharge status: Home / Self Care | Payer: Managed Care, Other (non HMO) | Source: Ambulatory Visit | Attending: Family Medicine | Admitting: Family Medicine

## 2022-07-24 ENCOUNTER — Encounter: Payer: Self-pay | Admitting: Family Medicine

## 2022-07-24 ENCOUNTER — Ambulatory Visit (INDEPENDENT_AMBULATORY_CARE_PROVIDER_SITE_OTHER): Payer: Managed Care, Other (non HMO) | Admitting: Family Medicine

## 2022-07-24 ENCOUNTER — Other Ambulatory Visit: Payer: Self-pay

## 2022-07-24 VITALS — BP 120/68 | HR 74 | Temp 97.8°F | Resp 16 | Ht 63.0 in | Wt 161.8 lb

## 2022-07-24 DIAGNOSIS — Z0000 Encounter for general adult medical examination without abnormal findings: Secondary | ICD-10-CM | POA: Diagnosis not present

## 2022-07-24 DIAGNOSIS — E7800 Pure hypercholesterolemia, unspecified: Secondary | ICD-10-CM | POA: Diagnosis not present

## 2022-07-24 DIAGNOSIS — Z1231 Encounter for screening mammogram for malignant neoplasm of breast: Secondary | ICD-10-CM | POA: Diagnosis not present

## 2022-07-24 DIAGNOSIS — Z131 Encounter for screening for diabetes mellitus: Secondary | ICD-10-CM

## 2022-07-24 DIAGNOSIS — L659 Nonscarring hair loss, unspecified: Secondary | ICD-10-CM

## 2022-07-24 DIAGNOSIS — L603 Nail dystrophy: Secondary | ICD-10-CM

## 2022-07-24 DIAGNOSIS — Z124 Encounter for screening for malignant neoplasm of cervix: Secondary | ICD-10-CM | POA: Diagnosis present

## 2022-07-24 DIAGNOSIS — E559 Vitamin D deficiency, unspecified: Secondary | ICD-10-CM

## 2022-07-24 DIAGNOSIS — Z130 Encounter for screening for diseases of the blood and blood-forming organs and certain disorders involving the immune mechanism: Secondary | ICD-10-CM

## 2022-07-25 LAB — CBC WITH DIFFERENTIAL/PLATELET
Absolute Monocytes: 276 cells/uL (ref 200–950)
Basophils Absolute: 19 cells/uL (ref 0–200)
Basophils Relative: 0.6 %
Eosinophils Absolute: 81 cells/uL (ref 15–500)
Eosinophils Relative: 2.6 %
HCT: 39.9 % (ref 35.0–45.0)
Hemoglobin: 13.4 g/dL (ref 11.7–15.5)
Lymphs Abs: 1209 cells/uL (ref 850–3900)
MCH: 29.6 pg (ref 27.0–33.0)
MCHC: 33.6 g/dL (ref 32.0–36.0)
MCV: 88.1 fL (ref 80.0–100.0)
MPV: 11.6 fL (ref 7.5–12.5)
Monocytes Relative: 8.9 %
Neutro Abs: 1516 cells/uL (ref 1500–7800)
Neutrophils Relative %: 48.9 %
Platelets: 229 10*3/uL (ref 140–400)
RBC: 4.53 10*6/uL (ref 3.80–5.10)
RDW: 12.8 % (ref 11.0–15.0)
Total Lymphocyte: 39 %
WBC: 3.1 10*3/uL — ABNORMAL LOW (ref 3.8–10.8)

## 2022-07-25 LAB — COMPLETE METABOLIC PANEL WITH GFR
AG Ratio: 1.6 (calc) (ref 1.0–2.5)
ALT: 14 U/L (ref 6–29)
AST: 16 U/L (ref 10–35)
Albumin: 4.4 g/dL (ref 3.6–5.1)
Alkaline phosphatase (APISO): 47 U/L (ref 37–153)
BUN: 11 mg/dL (ref 7–25)
CO2: 25 mmol/L (ref 20–32)
Calcium: 9.6 mg/dL (ref 8.6–10.4)
Chloride: 106 mmol/L (ref 98–110)
Creat: 0.76 mg/dL (ref 0.50–1.03)
Globulin: 2.8 g/dL (calc) (ref 1.9–3.7)
Glucose, Bld: 93 mg/dL (ref 65–99)
Potassium: 4.6 mmol/L (ref 3.5–5.3)
Sodium: 141 mmol/L (ref 135–146)
Total Bilirubin: 0.4 mg/dL (ref 0.2–1.2)
Total Protein: 7.2 g/dL (ref 6.1–8.1)
eGFR: 91 mL/min/{1.73_m2} (ref >=60)

## 2022-07-25 LAB — LIPID PANEL
Cholesterol: 218 mg/dL — ABNORMAL HIGH (ref <200)
HDL: 81 mg/dL (ref >=50)
LDL Cholesterol (Calc): 119 mg/dL (calc) — ABNORMAL HIGH
Non-HDL Cholesterol (Calc): 137 mg/dL (calc) — ABNORMAL HIGH (ref <130)
Total CHOL/HDL Ratio: 2.7 (calc) (ref <5.0)
Triglycerides: 82 mg/dL (ref <150)

## 2022-07-25 LAB — CYTOLOGY - PAP
Comment: NEGATIVE
Diagnosis: NEGATIVE
High risk HPV: NEGATIVE

## 2022-07-25 LAB — B12 AND FOLATE PANEL
Folate: >24 ng/mL
Vitamin B-12: 542 pg/mL (ref 200–1100)

## 2022-07-25 LAB — TSH: TSH: 1.55 mIU/L (ref 0.40–4.50)

## 2022-07-25 LAB — HEMOGLOBIN A1C
Hgb A1c MFr Bld: 5.3 % of total Hgb (ref <5.7)
Mean Plasma Glucose: 105 mg/dL
eAG (mmol/L): 5.8 mmol/L

## 2022-07-25 LAB — VITAMIN D 25 HYDROXY (VIT D DEFICIENCY, FRACTURES): Vit D, 25-Hydroxy: 35 ng/mL (ref 30–100)

## 2022-08-01 ENCOUNTER — Encounter: Payer: Self-pay | Admitting: Family Medicine

## 2022-08-02 ENCOUNTER — Other Ambulatory Visit: Payer: Self-pay

## 2022-08-02 DIAGNOSIS — D72819 Decreased white blood cell count, unspecified: Secondary | ICD-10-CM

## 2022-08-25 ENCOUNTER — Ambulatory Visit
Admission: RE | Admit: 2022-08-25 | Discharge: 2022-08-25 | Disposition: A | Discharge status: Home / Self Care | Payer: Managed Care, Other (non HMO) | Source: Ambulatory Visit | Attending: Family Medicine | Admitting: Family Medicine

## 2022-08-25 DIAGNOSIS — Z1231 Encounter for screening mammogram for malignant neoplasm of breast: Secondary | ICD-10-CM | POA: Diagnosis present

## 2022-08-25 DIAGNOSIS — Z0000 Encounter for general adult medical examination without abnormal findings: Secondary | ICD-10-CM | POA: Diagnosis present

## 2022-09-29 DIAGNOSIS — Z1379 Encounter for other screening for genetic and chromosomal anomalies: Secondary | ICD-10-CM | POA: Insufficient documentation

## 2023-07-25 ENCOUNTER — Encounter: Payer: Managed Care, Other (non HMO) | Admitting: Family Medicine

## 2023-07-30 ENCOUNTER — Encounter: Payer: Self-pay | Admitting: Family Medicine

## 2023-07-30 ENCOUNTER — Ambulatory Visit (INDEPENDENT_AMBULATORY_CARE_PROVIDER_SITE_OTHER): Payer: Self-pay | Admitting: Family Medicine

## 2023-07-30 VITALS — BP 110/76 | HR 84 | Resp 16 | Ht 63.0 in | Wt 176.6 lb

## 2023-07-30 DIAGNOSIS — Z0000 Encounter for general adult medical examination without abnormal findings: Secondary | ICD-10-CM

## 2023-07-30 DIAGNOSIS — Z131 Encounter for screening for diabetes mellitus: Secondary | ICD-10-CM

## 2023-07-30 DIAGNOSIS — Z1231 Encounter for screening mammogram for malignant neoplasm of breast: Secondary | ICD-10-CM | POA: Diagnosis not present

## 2023-07-30 DIAGNOSIS — E7800 Pure hypercholesterolemia, unspecified: Secondary | ICD-10-CM

## 2023-07-30 DIAGNOSIS — Z130 Encounter for screening for diseases of the blood and blood-forming organs and certain disorders involving the immune mechanism: Secondary | ICD-10-CM

## 2023-07-30 NOTE — Progress Notes (Signed)
 Name: Stephanie Pineda   MRN: [id number]    DOB: Sep 03, 1965   Date:07/30/2023       Progress Note  Subjective  Chief Complaint  Chief Complaint  Patient presents with  . Annual Exam    HPI  Patient presents for annual CPE.  Diet: avoids eating out, she will work on getting more vegetables  Exercise: walks 4 miles five days a week, she also does 15 minutes of core and back exercises Last Eye Exam: completed Last Dental Exam: completed  Flowsheet Row Office Visit from 07/30/2023 in [facility]  AUDIT-C Score 1      Depression: Phq 9 is  negative    07/30/2023    8:55 AM 07/24/2022    7:46 AM 11/04/2021    9:52 AM 07/18/2021    9:04 AM 12/17/2020    9:19 AM  Depression screen PHQ 2/9  Decreased Interest 0 0 0 0 0  Down, Depressed, Hopeless 0 0 0 0 0  PHQ - 2 Score 0 0 0 0 0  Altered sleeping 0  0 0 0  Tired, decreased energy 0  0 0 0  Change in appetite 0  0 0 0  Feeling bad or failure about yourself  0  0 0 0  Trouble concentrating 0  0 0 0  Moving slowly or fidgety/restless 0  0 0 0  Suicidal thoughts 0  0 0 0  PHQ-9 Score 0  0 0 0  Difficult doing work/chores Not difficult at all  Not difficult at all  Not difficult at all   Hypertension: BP Readings from Last 3 Encounters:  07/30/23 110/76  07/24/22 120/68  07/18/21 122/84   Obesity: Wt Readings from Last 3 Encounters:  07/30/23 176 lb 9.6 oz (80.1 kg)  07/24/22 161 lb 12.8 oz (73.4 kg)  07/18/21 173 lb (78.5 kg)   BMI Readings from Last 3 Encounters:  07/30/23 31.28 kg/m  07/24/22 28.66 kg/m  07/18/21 30.65 kg/m     Vaccines: discussed PCV but not interested   Hep C Screening: completed STD testing and prevention (HIV/chl/gon/syphilis): N/A Intimate partner violence: negative screen  Sexual History : not sexually active  Menstrual History/LMP/Abnormal Bleeding: post menopausal  Discussed importance of follow up if any post-menopausal bleeding: yes  Incontinence Symptoms:  positive for very mild urge symptoms   Breast cancer:  - Last Mammogram: up to date  - BRCA gene screening: she had genetic test   Osteoporosis Prevention : Discussed high calcium and vitamin D supplementation, weight bearing exercises Bone density :no   Cervical cancer screening: up-to-date  Skin cancer: Discussed monitoring for atypical lesions  Colorectal cancer: Repeat in 2026    Lung cancer:  Low Dose CT Chest recommended if Age 70-80 years, 20 pack-year currently smoking OR have quit w/in 15years. Patient does not qualify for screen   ECG: 2022  Advanced Care Planning: A voluntary discussion about advance care planning including the explanation and discussion of advance directives.  Discussed health care proxy and Living will, and the patient was able to identify a health care proxy as daughter .  Patient does not have a living will and power of attorney of health care   Patient Active Problem List   Diagnosis Date Noted  . Genetic testing 09/29/2022  . Vitamin D deficiency 05/16/2017  . Hypercholesteremia     Past Surgical History:  Procedure Laterality Date  . COLONOSCOPY WITH PROPOFOL N/A 02/18/2015   Procedure: COLONOSCOPY WITH PROPOFOL;  Surgeon: Midge Minium, MD;  Location: [facility];  Service: Endoscopy;  Laterality: N/A;  . ENDOMETRIAL ABLATION  2009   laser surgery for Dysplasia  . ESOPHAGOGASTRODUODENOSCOPY    . KNEE SURGERY    . TUBAL LIGATION      Family History  Problem Relation Age of Onset  . Asthma Mother   . Hyperlipidemia Mother   . Thyroid disease Mother   . COPD Mother   . Osteoporosis Mother   . Heart disease Mother   . Hyperlipidemia Father   . Heart disease Father   . Hypertension Father   . COPD Father   . Glaucoma Father   . Cancer Sister        melanoma  . Cancer Brother   . Diabetes Brother   . Heart disease Brother   . Hypertension Brother   . Lymphoma Brother   . Diabetes Maternal Grandmother   . Thyroid disease  Daughter   . Breast cancer Neg Hx     Social History   Socioeconomic History  . Marital status: Married    Spouse name: Mellody Dance  . Number of children: 1  . Years of education: 74  . Highest education level: Some college, no degree  Occupational History  . Not on file  Tobacco Use  . Smoking status: Former    Current packs/day: 0.00    Average packs/day: 1 pack/day for 20.0 years (20.0 ttl pk-yrs)    Types: Cigarettes    Start date: 05/01/1986    Quit date: 05/01/2006    Years since quitting: 17.2  . Smokeless tobacco: Never  Vaping Use  . Vaping status: Never Used  Substance and Sexual Activity  . Alcohol use: Yes    Comment: 2 a month  . Drug use: No  . Sexual activity: Not Currently    Partners: Male    Birth control/protection: Post-menopausal  Other Topics Concern  . Not on file  Social History Narrative  . Not on file   Social Drivers of Health   Financial Resource Strain: Low Risk  (07/30/2023)   Overall Financial Resource Strain (CARDIA)   . Difficulty of Paying Living Expenses: Not hard at all  Food Insecurity: No Food Insecurity (07/30/2023)   Hunger Vital Sign   . Worried About Programme researcher, broadcasting/film/video in the Last Year: Never true   . Ran Out of Food in the Last Year: Never true  Transportation Needs: No Transportation Needs (07/30/2023)   PRAPARE - Transportation   . Lack of Transportation (Medical): No   . Lack of Transportation (Non-Medical): No  Physical Activity: Sufficiently Active (07/30/2023)   Exercise Vital Sign   . Days of Exercise per Week: 5 days   . Minutes of Exercise per Session: 90 min  Stress: No Stress Concern Present (07/30/2023)   [employer] of Occupational Health - Occupational Stress Questionnaire   . Feeling of Stress : Only a little  Social Connections: Moderately Integrated (07/30/2023)   Social Connection and Isolation Panel [NHANES]   . Frequency of Communication with Friends and Family: Three times a week   . Frequency of Social  Gatherings with Friends and Family: Three times a week   . Attends Religious Services: More than 4 times per year   . Active Member of Clubs or Organizations: No   . Attends Banker Meetings: Never   . Marital Status: Married  Catering manager Violence: Not At Risk (07/30/2023)   Humiliation, Afraid, Rape, and Kick questionnaire   .  Fear of Current or Ex-Partner: No   . Emotionally Abused: No   . Physically Abused: No   . Sexually Abused: No     Current Outpatient Medications:  .  BL CALCIUM-MAGNESIUM-ZINC PO, Take by mouth., Disp: , Rfl:  .  VITAMIN D, CHOLECALCIFEROL, PO, Take by mouth., Disp: , Rfl:   No Known Allergies   ROS  Constitutional: Negative for fever or weight change.  Respiratory: Negative for cough and shortness of breath.   Cardiovascular: Negative for chest pain or palpitations.  Gastrointestinal: Negative for abdominal pain, no bowel changes.  Musculoskeletal: Negative for gait problem or joint swelling.  Skin: Negative for rash.  Neurological: Negative for dizziness or headache.  No other specific complaints in a complete review of systems (except as listed in HPI above).   Objective  Vitals:   07/30/23 0900  BP: 110/76  Pulse: 84  Resp: 16  SpO2: 97%  Weight: 176 lb 9.6 oz (80.1 kg)  Height: 5\' 3"  (1.6 m)    Body mass index is 31.28 kg/m.  Physical Exam  Constitutional: Patient appears well-developed and well-nourished. No distress.  HENT: Head: Normocephalic and atraumatic. Ears: B TMs ok, no erythema or effusion; Nose: Nose normal. Mouth/Throat: Oropharynx is clear and moist. No oropharyngeal exudate.  Eyes: Conjunctivae and EOM are normal. Pupils are equal, round, and reactive to light. No scleral icterus.  Neck: Normal range of motion. Neck supple. No JVD present. No thyromegaly present.  Cardiovascular: Normal rate, regular rhythm and normal heart sounds.  No murmur heard. No BLE edema. Pulmonary/Chest: Effort normal and  breath sounds normal. No respiratory distress. Abdominal: Soft. Bowel sounds are normal, no distension. There is no tenderness. no masses Breast: no lumps or masses, no nipple discharge or rashes FEMALE GENITALIA:  Not done  RECTAL: not done  Musculoskeletal: Normal range of motion, no joint effusions. No gross deformities Neurological: he is alert and oriented to person, place, and time. No cranial nerve deficit. Coordination, balance, strength, speech and gait are normal.  Skin: Skin is warm and dry. No rash noted. No erythema.  Psychiatric: Patient has a normal mood and affect. behavior is normal. Judgment and thought content normal.     Assessment & Plan  1. Well adult exam (Primary)  - MM 3D SCREENING MAMMOGRAM BILATERAL BREAST; Future - Lipid panel - CBC with Differential/Platelet - COMPLETE METABOLIC PANEL WITHOUT GFR - Hemoglobin A1c  2. Encounter for screening mammogram for malignant neoplasm of breast  - MM 3D SCREENING MAMMOGRAM BILATERAL BREAST; Future  3. Hypercholesteremia  - Lipid panel  4. Diabetes mellitus screening  - Hemoglobin A1c  5. Screening for deficiency anemia  - CBC with Differential/Platelet   -USPSTF grade A and B recommendations reviewed with patient; age-appropriate recommendations, preventive care, screening tests, etc discussed and encouraged; healthy living encouraged; see AVS for patient education given to patient -Discussed importance of 150 minutes of physical activity weekly, eat two servings of fish weekly, eat one serving of tree nuts ( cashews, pistachios, pecans, almonds.Marland Kitchen) every other day, eat 6 servings of fruit/vegetables daily and drink plenty of water and avoid sweet beverages.   -Reviewed Health Maintenance: Yes.

## 2023-07-31 ENCOUNTER — Encounter: Payer: Self-pay | Admitting: Family Medicine

## 2023-07-31 LAB — CBC WITH DIFFERENTIAL/PLATELET
Absolute Lymphocytes: 1760 {cells}/uL (ref 850–3900)
Absolute Monocytes: 510 {cells}/uL (ref 200–950)
Basophils Absolute: 41 {cells}/uL (ref 0–200)
Basophils Relative: 0.8 %
Eosinophils Absolute: 112 {cells}/uL (ref 15–500)
Eosinophils Relative: 2.2 %
HCT: 40.7 % (ref 35.0–45.0)
Hemoglobin: 13.3 g/dL (ref 11.7–15.5)
MCH: 29 pg (ref 27.0–33.0)
MCHC: 32.7 g/dL (ref 32.0–36.0)
MCV: 88.7 fL (ref 80.0–100.0)
MPV: 10.9 fL (ref 7.5–12.5)
Monocytes Relative: 10 %
Neutro Abs: 2678 {cells}/uL (ref 1500–7800)
Neutrophils Relative %: 52.5 %
Platelets: 240 10*3/uL (ref 140–400)
RBC: 4.59 10*6/uL (ref 3.80–5.10)
RDW: 12.8 % (ref 11.0–15.0)
Total Lymphocyte: 34.5 %
WBC: 5.1 10*3/uL (ref 3.8–10.8)

## 2023-07-31 LAB — COMPLETE METABOLIC PANEL WITHOUT GFR
AG Ratio: 1.7 (calc) (ref 1.0–2.5)
ALT: 13 U/L (ref 6–29)
AST: 18 U/L (ref 10–35)
Albumin: 4.7 g/dL (ref 3.6–5.1)
Alkaline phosphatase (APISO): 54 U/L (ref 37–153)
BUN: 10 mg/dL (ref 7–25)
CO2: 30 mmol/L (ref 20–32)
Calcium: 9.8 mg/dL (ref 8.6–10.4)
Chloride: 103 mmol/L (ref 98–110)
Creat: 0.75 mg/dL (ref 0.50–1.03)
Globulin: 2.7 g/dL (ref 1.9–3.7)
Glucose, Bld: 99 mg/dL (ref 65–99)
Potassium: 5.1 mmol/L (ref 3.5–5.3)
Sodium: 140 mmol/L (ref 135–146)
Total Bilirubin: 0.4 mg/dL (ref 0.2–1.2)
Total Protein: 7.4 g/dL (ref 6.1–8.1)

## 2023-07-31 LAB — LIPID PANEL
Cholesterol: 249 mg/dL — ABNORMAL HIGH (ref <200)
HDL: 84 mg/dL (ref >=50)
LDL Cholesterol (Calc): 146 mg/dL — ABNORMAL HIGH
Non-HDL Cholesterol (Calc): 165 mg/dL — ABNORMAL HIGH (ref <130)
Total CHOL/HDL Ratio: 3 (calc) (ref <5.0)
Triglycerides: 86 mg/dL (ref <150)

## 2023-07-31 LAB — HEMOGLOBIN A1C
Hgb A1c MFr Bld: 5.4 %{Hb} (ref <5.7)
Mean Plasma Glucose: 108 mg/dL
eAG (mmol/L): 6 mmol/L

## 2024-03-22 ENCOUNTER — Emergency Department

## 2024-03-22 ENCOUNTER — Other Ambulatory Visit: Payer: Self-pay

## 2024-03-22 ENCOUNTER — Emergency Department: Admission: EM | Admit: 2024-03-22 | Discharge: 2024-03-22 | Disposition: A | Discharge status: Home / Self Care

## 2024-03-22 DIAGNOSIS — R072 Precordial pain: Secondary | ICD-10-CM | POA: Diagnosis present

## 2024-03-22 DIAGNOSIS — R079 Chest pain, unspecified: Secondary | ICD-10-CM

## 2024-03-22 LAB — BASIC METABOLIC PANEL WITH GFR
Anion gap: 9 (ref 5–15)
BUN: 11 mg/dL (ref 6–20)
CO2: 25 mmol/L (ref 22–32)
Calcium: 9.3 mg/dL (ref 8.9–10.3)
Chloride: 104 mmol/L (ref 98–111)
Creatinine, Ser: 0.8 mg/dL (ref 0.44–1.00)
GFR, Estimated: >60 mL/min (ref >60)
Glucose, Bld: 101 mg/dL — ABNORMAL HIGH (ref 70–99)
Potassium: 4 mmol/L (ref 3.5–5.1)
Sodium: 138 mmol/L (ref 135–145)

## 2024-03-22 LAB — CBC
HCT: 39.8 % (ref 36.0–46.0)
Hemoglobin: 13.2 g/dL (ref 12.0–15.0)
MCH: 29.3 pg (ref 26.0–34.0)
MCHC: 33.2 g/dL (ref 30.0–36.0)
MCV: 88.4 fL (ref 80.0–100.0)
Platelets: 221 K/uL (ref 150–400)
RBC: 4.5 MIL/uL (ref 3.87–5.11)
RDW: 13.2 % (ref 11.5–15.5)
WBC: 6.8 K/uL (ref 4.0–10.5)
nRBC: 0 % (ref 0.0–0.2)

## 2024-03-22 LAB — TROPONIN T, HIGH SENSITIVITY: Troponin T High Sensitivity: <15 ng/L (ref 0–19)

## 2024-03-22 LAB — RESP PANEL BY RT-PCR (RSV, FLU A&B, COVID)  RVPGX2
Influenza A by PCR: NEGATIVE
Influenza B by PCR: NEGATIVE
Resp Syncytial Virus by PCR: NEGATIVE
SARS Coronavirus 2 by RT PCR: NEGATIVE

## 2024-03-22 NOTE — ED Provider Notes (Signed)
 [facility] Provider Note    Event Date/Time   First MD Initiated Contact with Patient 03/22/24 [phone]     (approximate)   History   Chest Pain   HPI  TENZIN PAVON is a 58 y.o. female who presents today with concern of chest pain.  She was awoken this morning around 3 AM with concern of severe substernal chest pain, she took 2 full-strength aspirin, the pain has improved from the initial onset, since then has been off and on.  No history of similar pain before in the past denies any known history of coronary artery disease.  No known tobacco use no fevers chills nausea vomiting or any other symptoms associated with this.  She does have an extensive family history of coronary artery disease as young as 31 for her brother.  Denies any tobacco use occasional alcohol use no illicit substance use.  She has no other complaints at this time no associated shortness of breath no prior history of PEs or DVTs no swelling in her extremities, no recent travel no cancer history no hemoptysis no hormonal therapy no recent surgeries.      Physical Exam   Triage Vital Signs: ED Triage Vitals  Encounter Vitals Group     BP 03/22/24 0831 121/72     Girls Systolic BP Percentile --      Girls Diastolic BP Percentile --      Boys Systolic BP Percentile --      Boys Diastolic BP Percentile --      Pulse Rate 03/22/24 0831 77     Resp 03/22/24 0831 16     Temp 03/22/24 0831 98.6 F (37 C)     Temp Source 03/22/24 0831 Oral     SpO2 03/22/24 0831 100 %     Weight 03/22/24 0826 176 lb 9.4 oz (80.1 kg)     Height --      Head Circumference --      Peak Flow --      Pain Score 03/22/24 0826 5     Pain Loc --      Pain Education --      Exclude from Growth Chart --     Most recent vital signs: Vitals:   03/22/24 0915 03/22/24 0920  BP: 119/78   Pulse: 73   Resp: 18   Temp:    SpO2: 96% 98%     General: Awake, no distress.  CV:  Good peripheral perfusion.  No  appreciated murmurs rubs or gallops, no appreciated carotid bruits Resp:  Normal effort.  Abd:  No distention.  Other:     ED Results / Procedures / Treatments   Labs (all labs ordered are listed, but only abnormal results are displayed) Labs Reviewed  BASIC METABOLIC PANEL WITH GFR - Abnormal; Notable for the following components:      Result Value   Glucose, Bld 101 (*)    All other components within normal limits  RESP PANEL BY RT-PCR (RSV, FLU A&B, COVID)  RVPGX2  CBC  TROPONIN T, HIGH SENSITIVITY     EKG  Sinus rhythm with a rate of about 70, axis of about 60, intervals appear to be within normal limits and no obvious ischemia that I appreciate on this EKG   RADIOLOGY No acute cardiopulmonary process appreciated  PROCEDURES:  Critical Care performed: No  Procedures   MEDICATIONS ORDERED IN ED: Medications - No data to display   IMPRESSION / MDM /  ASSESSMENT AND PLAN / ED COURSE  I reviewed the triage vital signs and the nursing notes.                               Patient's presentation is most consistent with acute, uncomplicated illness.  58 year old female who presents today with concern of chest pain.  History seems unlikely consistent with ACS.  She did take 2 full-strength aspirins prior to arrival.  Still with some minor substernal chest pain, but significantly improved since the initial onset.  EKG is without evidence of ischemia, troponin here is negative, chest x-ray is without acute findings.  Given the presentation here at this time I believe reasonable for outpatient cardiology follow-up as overall history is unlikely consistent with ACS and she does not have much other underlying risk factors.  Will have her discharged home at this time, discussed return precautions, patient verbalizes understanding and is agreeable with the plan.       FINAL CLINICAL IMPRESSION(S) / ED DIAGNOSES   Final diagnoses:  Chest pain, unspecified type     Rx /  DC Orders   ED Discharge Orders          Ordered    Ambulatory referral to Cardiology       Comments: If you have not heard from the Cardiology office within the next 72 hours please call [phone].   03/22/24 0930             Note:  This document was prepared using Dragon voice recognition software and may include unintentional dictation errors.   Fernand Rossie HERO, MD 03/22/24 [phone]

## 2024-03-22 NOTE — Discharge Instructions (Signed)
 You were seen today due to concern of chest pain.  At this time your labs are fortunately reassuring.  I have placed a referral to our cardiology team, they should contact you to arrange for a follow-up appointment.  If you have any worsening symptoms such as increased chest pain, shortness of breath, or any other symptoms you find concerning please return to the emergency department immediately for further medical management.  Otherwise you should follow-up with the cardiologist as planned.

## 2024-03-22 NOTE — ED Triage Notes (Signed)
 C/O mid chest pain. Started this morning. Pain is intermittently sharp and dull. No aggravating or alleviating factors. Also c/o generalized pain to hips, back, feet this morning.  AAOx3. Skin warm and dry. No SOB/DOE NAD

## 2024-03-31 NOTE — Progress Notes (Unsigned)
  Cardiology Office Note   Date:  04/02/2024  ID:  Stephanie Pineda, DOB 22-Oct-1965, MRN [id number] PCP: Sowles, Krichna, MD  [facility] Providers Cardiologist:  Caron Poser, MD     History of Present Illness Stephanie Pineda is a 58 y.o. female no relevant PMH who presents for further evaluation management of chest discomfort.  Patient seen in the ED for this issue 03/22/2024.  Troponin, RVP, CBC, BMP all unremarkable.  She was felt to be low risk so discharged home.  Last LDL 146 06/2023.  Patient reports that she had sudden onset of chest discomfort at rest prompting the ED visit.  She notes that it felt like there was elephant sitting on her chest.  She has had some similar pain since her ED visit, but not quite as pronounced.  She has also had some recent dyspnea on exertion which is new for her.  She notes a family history of premature ASCVD in her brother.  She does not have any personal history of CVD.  Relevant CVD History -None   ROS: Pt denies any jaw pain, arm pain, palpitations, syncope, presyncope, orthopnea, PND, or LE edema.  Studies Reviewed I have independently reviewed the patient's ECG, previous medical records, recent blood work.  Physical Exam VS:  BP 118/80 (BP Location: Left Arm, Patient Position: Sitting, Cuff Size: Normal)   Pulse 69   Ht 5' 2.5 (1.588 m)   Wt 188 lb (85.3 kg)   SpO2 97%   BMI 33.84 kg/m        Wt Readings from Last 3 Encounters:  04/02/24 188 lb (85.3 kg)  03/22/24 176 lb 9.4 oz (80.1 kg)  07/30/23 176 lb 9.6 oz (80.1 kg)    GEN: No acute distress. NECK: No JVD; No carotid bruits. CARDIAC: RRR, no murmurs, rubs, gallops. RESPIRATORY:  Clear to auscultation. EXTREMITIES:  Warm and well-perfused. No edema.  ASSESSMENT AND PLAN Chest discomfort Family history of premature ASCVD DOE Patient presents with undifferentiated chest pain that has cardiac features.  She also has a family history of premature ASCVD in her brother.   Also has dyspnea on exertion.  Further cardiac testing is indicated.  Plan: - Coronary CT angiogram to evaluate for obstructive CAD - Echocardiogram to evaluate structural etiologies of symptoms - While workup is underway, we will start ASA 81 mg daily and Crestor 5 mg daily; these can be stopped if her CTA shows CAD RADS 0  HLD ASCVD risk score 2.2% indicating low risk. However, given recent chest pain, will opt for treatment and LDL goal <100. If her testing shows that she has no plaque, then we can likely stop as above.        Dispo: RTC 3 months or sooner as needed  Signed, Caron Poser, MD

## 2024-04-02 ENCOUNTER — Ambulatory Visit

## 2024-04-02 VITALS — BP 118/80 | HR 69 | Ht 62.5 in | Wt 188.0 lb

## 2024-04-02 DIAGNOSIS — E782 Mixed hyperlipidemia: Secondary | ICD-10-CM

## 2024-04-02 DIAGNOSIS — R0609 Other forms of dyspnea: Secondary | ICD-10-CM

## 2024-04-02 DIAGNOSIS — Z79899 Other long term (current) drug therapy: Secondary | ICD-10-CM

## 2024-04-02 DIAGNOSIS — R072 Precordial pain: Secondary | ICD-10-CM

## 2024-04-02 DIAGNOSIS — Z8249 Family history of ischemic heart disease and other diseases of the circulatory system: Secondary | ICD-10-CM

## 2024-04-02 DIAGNOSIS — R079 Chest pain, unspecified: Secondary | ICD-10-CM | POA: Diagnosis not present

## 2024-04-02 MED ORDER — ROSUVASTATIN CALCIUM 5 MG PO TABS: 5.0000 mg | ORAL_TABLET | Freq: Every day | ORAL | 3 refills | Status: DC

## 2024-04-02 MED ORDER — METOPROLOL TARTRATE 50 MG PO TABS: ORAL_TABLET | ORAL | 0 refills | Status: AC

## 2024-04-02 MED ORDER — ASPIRIN 81 MG PO TBEC: 81.0000 mg | DELAYED_RELEASE_TABLET | Freq: Every day | ORAL | Status: AC

## 2024-04-02 NOTE — Patient Instructions (Addendum)
 Medication Instructions:  Your physician recommends the following medication changes.  START TAKING:  Aspirin 81 mg by mouth once daily Rosuvastatin (CRESTOR) 5 mg by mouth once daily  *If you need a refill on your cardiac medications before your next appointment, please call your pharmacy*  Lab Work: BMP If you have labs (blood work) drawn today and your tests are completely normal, you will receive your results only by: MyChart Message (if you have MyChart) OR A paper copy in the mail If you have any lab test that is abnormal or we need to change your treatment, we will call you to review the results.  Testing/Procedures: Your physician has requested that you have an echocardiogram. Echocardiography is a painless test that uses sound waves to create images of your heart. It provides your doctor with information about the size and shape of your heart and how well your heart's chambers and valves are working.   You may receive an ultrasound enhancing agent through an IV if needed to better visualize your heart during the echo. This procedure takes approximately one hour.  There are no restrictions for this procedure.  This will take place at [address] [facility] [address]) [address], [place] [address]  Please note: We ask at that you not bring children with you during ultrasound (echo/ vascular) testing. Due to room size and safety concerns, children are not allowed in the ultrasound rooms during exams. Our front office staff cannot provide observation of children in our lobby area while testing is being conducted. An adult accompanying a patient to their appointment will only be allowed in the ultrasound room at the discretion of the ultrasound technician under special circumstances. We apologize for any inconvenience.     Your cardiac CT will be scheduled at one of the below locations:   [facility] [address] [place], [place] [address] [phone]  Please arrive 15 mins early for check-in and test prep.  There is spacious parking Copy Available) and easy access to the radiology department from the [facility] entrance. Please enter here and check-in with the desk attendant.   Please follow these instructions carefully (unless otherwise directed):  An IV will be required for this test and Nitroglycerin will be given.  Hold all erectile dysfunction medications at least 3 days (72 hrs) prior to test. (Ie viagra, cialis, sildenafil, tadalafil, etc)   On the Night Before the Test: Be sure to Drink plenty of water . Do not consume any caffeinated/decaffeinated beverages or chocolate 12 hours prior to your test. Do not take any antihistamines 12 hours prior to your test.  On the Day of the Test: Drink plenty of water  until 1 hour prior to the test. Do not eat any food 1 hour prior to test. You may take your regular medications prior to the test.  Take metoprolol (Lopressor) 50 mg by mouth two hours prior to test. FEMALES- please wear underwire-free bra if available, avoid dresses & tight clothing      After the Test: Drink plenty of water . After receiving IV contrast, you may experience a mild flushed feeling. This is normal. On occasion, you may experience a mild rash up to 24 hours after the test. This is not dangerous. If this occurs, you can take Benadryl 25 mg, Zyrtec, Claritin, or Allegra and increase your fluid intake. (Patients taking Tikosyn should avoid Benadryl, and may take Zyrtec, Claritin, or Allegra) If you experience trouble breathing, this can be  serious. If it is severe call 911 IMMEDIATELY. If it is mild, please call our office.  We will call to schedule your test 2-4 weeks out understanding that some insurance companies will need an authorization prior to the service being performed.   For more information and frequently asked questions, please visit our website :  [email or web address]  For non-scheduling related questions, please contact the cardiac imaging nurse navigator should you have any questions/concerns: Cardiac Imaging Nurse Navigators Direct Office Dial: [phone]   For scheduling needs, including cancellations and rescheduling, please call Brittany, [phone].     Follow-Up: At [facility], you and your health needs are our priority.  As part of our continuing mission to provide you with exceptional heart care, our providers are all part of one team.  This team includes your primary Cardiologist (physician) and Advanced Practice Providers or APPs (Physician Assistants and Nurse Practitioners) who all work together to provide you with the care you need, when you need it.  Your next appointment:  3 month(s)  Provider:  Caron Poser, MD    We recommend signing up for the patient portal called MyChart.  Sign up information is provided on this After Visit Summary.  MyChart is used to connect with patients for Virtual Visits (Telemedicine).  Patients are able to view lab/test results, encounter notes, upcoming appointments, etc.  Non-urgent messages can be sent to your provider as well.   To learn more about what you can do with MyChart, go to [email or web address].

## 2024-04-03 ENCOUNTER — Ambulatory Visit: Payer: Self-pay

## 2024-04-03 LAB — BASIC METABOLIC PANEL WITH GFR
BUN/Creatinine Ratio: 12 (ref 12–28)
BUN: 10 mg/dL (ref 8–27)
CO2: 24 mmol/L (ref 20–29)
Calcium: 9.8 mg/dL (ref 8.7–10.3)
Chloride: 103 mmol/L (ref 96–106)
Creatinine, Ser: 0.82 mg/dL (ref 0.57–1.00)
Glucose: 96 mg/dL (ref 70–99)
Potassium: 4.5 mmol/L (ref 3.5–5.2)
Sodium: 139 mmol/L (ref 134–144)
eGFR: 82 mL/min/1.73 (ref >59)

## 2024-04-11 MED ORDER — ATORVASTATIN CALCIUM 20 MG PO TABS: 20.0000 mg | ORAL_TABLET | Freq: Every day | ORAL | 3 refills | Status: DC

## 2024-04-17 ENCOUNTER — Encounter (HOSPITAL_COMMUNITY): Payer: Self-pay

## 2024-04-21 ENCOUNTER — Ambulatory Visit
Admission: RE | Admit: 2024-04-21 | Discharge: 2024-04-21 | Disposition: A | Discharge status: Home / Self Care | Source: Ambulatory Visit

## 2024-04-21 DIAGNOSIS — R072 Precordial pain: Secondary | ICD-10-CM

## 2024-04-21 MED ORDER — NITROGLYCERIN 0.4 MG SL SUBL
0.8000 mg | SUBLINGUAL_TABLET | Freq: Once | SUBLINGUAL | Status: AC
  Administered 2024-04-21: 0.8 mg via SUBLINGUAL
  Filled 2024-04-21: qty 25

## 2024-04-21 MED ORDER — IOHEXOL 350 MG/ML SOLN
100.0000 mL | Freq: Once | INTRAVENOUS | Status: AC | PRN
  Administered 2024-04-21: 100 mL via INTRAVENOUS

## 2024-04-21 MED ORDER — METOPROLOL TARTRATE 5 MG/5ML IV SOLN: 10.0000 mg | Freq: Once | INTRAVENOUS | Status: DC | PRN

## 2024-04-21 MED ORDER — DILTIAZEM HCL 25 MG/5ML IV SOLN: 10.0000 mg | INTRAVENOUS | Status: DC | PRN

## 2024-05-06 ENCOUNTER — Ambulatory Visit

## 2024-05-06 DIAGNOSIS — R079 Chest pain, unspecified: Secondary | ICD-10-CM | POA: Diagnosis not present

## 2024-05-06 DIAGNOSIS — R0609 Other forms of dyspnea: Secondary | ICD-10-CM

## 2024-05-06 LAB — ECHOCARDIOGRAM COMPLETE
AR max vel: 2.91 cm2
AV Area VTI: 2.94 cm2
AV Area mean vel: 2.92 cm2
AV Mean grad: 3 mmHg
AV Peak grad: 6.2 mmHg
Ao pk vel: 1.24 m/s
Area-P 1/2: 3.27 cm2
Calc EF: 56.7 %
S' Lateral: 2.7 cm
Single Plane A2C EF: 59 %
Single Plane A4C EF: 55.6 %

## 2024-05-28 MED ORDER — REPATHA SURECLICK 140 MG/ML ~~LOC~~ SOAJ: 140.0000 mg | SUBCUTANEOUS | 6 refills | Status: AC

## 2024-05-30 ENCOUNTER — Other Ambulatory Visit (HOSPITAL_COMMUNITY): Payer: Self-pay

## 2024-05-30 ENCOUNTER — Telehealth: Payer: Self-pay | Admitting: Pharmacy Technician

## 2024-05-30 NOTE — Telephone Encounter (Signed)
" ° °  Ran test claim for repatha  and this was just filled, too soon until 06/10/24. PA is not needed at this time.  "

## 2024-07-01 ENCOUNTER — Ambulatory Visit

## 2024-07-30 ENCOUNTER — Encounter: Admitting: Family Medicine
